# Patient Record
Sex: Male | Born: 1955 | Race: White | Hispanic: No | Marital: Single | Smoking: Current every day smoker
Health system: Southern US, Community
[De-identification: ages and names within clinical notes are randomized; demographics above are authoritative.]

## PROBLEM LIST (undated history)

## (undated) HISTORY — PX: APPENDECTOMY: SHX54

---

## 2016-09-28 ENCOUNTER — Emergency Department: Payer: 59

## 2016-09-28 ENCOUNTER — Emergency Department
Admission: EM | Admit: 2016-09-28 | Discharge: 2016-09-28 | Disposition: A | Discharge status: Home / Self Care | Payer: 59 | Attending: Emergency Medicine | Admitting: Emergency Medicine

## 2016-09-28 ENCOUNTER — Encounter: Payer: Self-pay | Admitting: Emergency Medicine

## 2016-09-28 DIAGNOSIS — I509 Heart failure, unspecified: Secondary | ICD-10-CM | POA: Insufficient documentation

## 2016-09-28 DIAGNOSIS — F17200 Nicotine dependence, unspecified, uncomplicated: Secondary | ICD-10-CM | POA: Diagnosis not present

## 2016-09-28 DIAGNOSIS — R000 Tachycardia, unspecified: Secondary | ICD-10-CM

## 2016-09-28 DIAGNOSIS — R0600 Dyspnea, unspecified: Secondary | ICD-10-CM | POA: Diagnosis present

## 2016-09-28 LAB — URINALYSIS, COMPLETE (UACMP) WITH MICROSCOPIC
Bacteria, UA: NONE SEEN
Bilirubin Urine: NEGATIVE
Glucose, UA: NEGATIVE mg/dL
Hgb urine dipstick: NEGATIVE
KETONES UR: 5 mg/dL — AB
LEUKOCYTES UA: NEGATIVE
NITRITE: NEGATIVE
PH: 5 (ref 5.0–8.0)
Protein, ur: 100 mg/dL — AB
Specific Gravity, Urine: 1.019 (ref 1.005–1.030)

## 2016-09-28 LAB — BASIC METABOLIC PANEL
ANION GAP: 8 (ref 5–15)
BUN: 6 mg/dL (ref 6–20)
CALCIUM: 8.9 mg/dL (ref 8.9–10.3)
CO2: 30 mmol/L (ref 22–32)
Chloride: 100 mmol/L — ABNORMAL LOW (ref 101–111)
Creatinine, Ser: 0.71 mg/dL (ref 0.61–1.24)
GFR calc Af Amer: >60 mL/min (ref >60)
Glucose, Bld: 156 mg/dL — ABNORMAL HIGH (ref 65–99)
Potassium: 3.6 mmol/L (ref 3.5–5.1)
SODIUM: 138 mmol/L (ref 135–145)

## 2016-09-28 LAB — CBC
HCT: 52 % (ref 40.0–52.0)
HEMOGLOBIN: 17.7 g/dL (ref 13.0–18.0)
MCH: 33.1 pg (ref 26.0–34.0)
MCHC: 34 g/dL (ref 32.0–36.0)
MCV: 97.4 fL (ref 80.0–100.0)
Platelets: 185 10*3/uL (ref 150–440)
RBC: 5.34 MIL/uL (ref 4.40–5.90)
RDW: 14.3 % (ref 11.5–14.5)
WBC: 7.9 10*3/uL (ref 3.8–10.6)

## 2016-09-28 LAB — TROPONIN I
TROPONIN I: 0.04 ng/mL — AB (ref <0.03)
Troponin I: 0.04 ng/mL (ref <0.03)

## 2016-09-28 LAB — BRAIN NATRIURETIC PEPTIDE: B Natriuretic Peptide: 958 pg/mL — ABNORMAL HIGH (ref 0.0–100.0)

## 2016-09-28 MED ORDER — FUROSEMIDE 40 MG PO TABS
40.0000 mg | ORAL_TABLET | Freq: Once | ORAL | Status: AC
  Administered 2016-09-28: 40 mg via ORAL
  Filled 2016-09-28: qty 1

## 2016-09-28 MED ORDER — IOPAMIDOL (ISOVUE-370) INJECTION 76%
75.0000 mL | Freq: Once | INTRAVENOUS | Status: AC | PRN
  Administered 2016-09-28: 75 mL via INTRAVENOUS

## 2016-09-28 MED ORDER — LORAZEPAM 2 MG/ML IJ SOLN
INTRAMUSCULAR | Status: AC
  Administered 2016-09-28: 0.5 mg via INTRAVENOUS
  Filled 2016-09-28: qty 1

## 2016-09-28 MED ORDER — FUROSEMIDE 20 MG PO TABS: 20.0000 mg | ORAL_TABLET | Freq: Every day | ORAL | 0 refills | Status: AC

## 2016-09-28 MED ORDER — POTASSIUM CHLORIDE ER 10 MEQ PO TBCR: 10.0000 meq | EXTENDED_RELEASE_TABLET | Freq: Every day | ORAL | 0 refills | Status: AC

## 2016-09-28 MED ORDER — ASPIRIN 81 MG PO CHEW
81.0000 mg | CHEWABLE_TABLET | Freq: Once | ORAL | Status: AC
  Administered 2016-09-28: 81 mg via ORAL
  Filled 2016-09-28: qty 1

## 2016-09-28 MED ORDER — LORAZEPAM 2 MG/ML IJ SOLN
0.5000 mg | Freq: Once | INTRAMUSCULAR | Status: AC
  Administered 2016-09-28: 0.5 mg via INTRAVENOUS

## 2016-09-28 NOTE — ED Provider Notes (Signed)
[facility] Emergency Department Provider Note ____________________________________________   I have reviewed the triage vital signs and the triage nursing note.  HISTORY  Chief Complaint Weakness   Historian Patient is here with a friend  HPI Jeffrey Pittman is a 61 y.o. male has not seen a doctor for about 12 years now, states that over the past several months he's had increased generalized weakness especially with exertion, dyspnea on exertion especially with going upstairs. Symptoms seem to be worse over the past few weeks. He's had increased lower extremity edema, with wheezing especially from the right lower extremity within the last week or so. No fever. No coughing. Denies chest pain. Denies palpitations. Denies abdominal pain. Denies nausea.  Symptoms are considered moderate.  No PCP.    History reviewed. No pertinent past medical history.  There are no active problems to display for this patient.   Past Surgical History:  Procedure Laterality Date  . APPENDECTOMY      Prior to Admission medications   Not on File    No Known Allergies  No family history on file.  Social History Social History  Substance Use Topics  . Smoking status: Current Every Day Smoker  . Smokeless tobacco: Never Used  . Alcohol use Yes    Review of Systems  Constitutional: Negative for fever. Eyes: Negative for visual changes. ENT: Negative for sore throat. Cardiovascular: Negative for chest pain. Respiratory: positive for shortness of breath with exertion, states he can walk around the house without any problem, its longer periods walking the crit exertion and upstairs.. Gastrointestinal: Negative for abdominal pain, vomiting and diarrhea. Genitourinary: Negative for dysuria. Musculoskeletal: Negative for back pain. Skin: Negative for rash. Neurological: Negative for headache. 10 point Review of Systems otherwise  negative ____________________________________________   PHYSICAL EXAM:  VITAL SIGNS: ED Triage Vitals  Enc Vitals Group     BP 09/28/16 1458 (!) 175/93     Pulse Rate 09/28/16 1458 (!) 116     Resp 09/28/16 1458 20     Temp 09/28/16 1458 97.7 F (36.5 C)     Temp Source 09/28/16 1458 Oral     SpO2 09/28/16 1458 96 %     Weight 09/28/16 1458 230 lb (104.3 kg)     Height 09/28/16 1458 6' (1.829 m)     Head Circumference --      Peak Flow --      Pain Score 09/28/16 1609 0     Pain Loc --      Pain Edu? --      Excl. in GC? --      Constitutional: Alert and oriented. Well appearing and in no distress. HEENT   Head: Normocephalic and atraumatic.      Eyes: Conjunctivae are normal. PERRL. Normal extraocular movements.      Ears:         Nose: No congestion/rhinnorhea.   Mouth/Throat: Mucous membranes are moist.   Neck: No stridor. Cardiovascular/Chest:slightly tachycardic, irregularly irregular..  No murmurs, rubs, or gallops. Respiratory: Normal respiratory effort without tachypnea nor retractions. Breath sounds are clear and equal bilaterally. Mildly decreased lung sounds, but no clear wheezing or rhonchi or rales appreciated. Gastrointestinal: Soft. No distention, no guarding, no rebound. Nontender.  Morbidly obese.  Genitourinary/rectal:Deferred Musculoskeletal: Nontender with normal range of motion in all extremities. Severe lower extremity edema bilaterally with some stretching/erythema to both lower extremities without cellulitis with some weeping from the right lower extremity which does not appear infected. Neurologic:  Normal speech and language. No gross or focal neurologic deficits are appreciated. Skin:  Skin is warm, dry and intact. No rash noted. Psychiatric: Mood and affect are normal. Speech and behavior are normal. Patient exhibits appropriate insight and judgment.   ____________________________________________  LABS (pertinent  positives/negatives)  Labs Reviewed  BASIC METABOLIC PANEL - Abnormal; Notable for the following:       Result Value   Chloride 100 (*)    Glucose, Bld 156 (*)    All other components within normal limits  URINALYSIS, COMPLETE (UACMP) WITH MICROSCOPIC - Abnormal; Notable for the following:    Color, Urine AMBER (*)    APPearance CLEAR (*)    Ketones, ur 5 (*)    Protein, ur 100 (*)    Squamous Epithelial / LPF 0-5 (*)    All other components within normal limits  BRAIN NATRIURETIC PEPTIDE - Abnormal; Notable for the following:    B Natriuretic Peptide 958.0 (*)    All other components within normal limits  TROPONIN I - Abnormal; Notable for the following:    Troponin I 0.04 (*)    All other components within normal limits  TROPONIN I - Abnormal; Notable for the following:    Troponin I 0.04 (*)    All other components within normal limits  CBC  CBG MONITORING, ED    ____________________________________________    EKG I, Governor Rooksebecca Awesome Jared, MD, the attending physician have personally viewed and interpreted all ECGs.  101 bpm. Sinus tachycardia. Several PACs. Normal axis.Incomplete right bundle branch block. Nonspecific ST and T-wave. ____________________________________________  RADIOLOGY All Xrays were viewed by me. Imaging interpreted by Radiologist.  Chest x-ray two-view:  IMPRESSION: Congestive heart failure.  Masslike consolidation of the lateral right lung base. Further evaluation with chest CT is recommended.  Lower extremity venous Doppler bilateral:  IMPRESSION: 1. No evidence of deep venous thrombosis. Evaluation is somewhat suboptimal due to guarding and soft tissue edema. 2. Soft tissue edema noted at both calves. 3. 2.2 cm right inguinal node seen. This is of uncertain significance. Would correlate for evidence of infection.   CT chest with contrast angiogram:  IMPRESSION: 1. No evidence of central pulmonary embolus. 2. Mildly loculated small bilateral  pleural effusions. Right basilar density noted on chest radiograph reflects loculated fluid. Mild bibasilar atelectasis seen. Interstitial prominence may reflect mild interstitial edema. 3. Diffuse coronary artery calcifications seen.  Mild cardiomegaly. 4. Enlarged mediastinal and right hilar nodes measure up to 2.2 cm in short axis, of uncertain significance. Further evaluation could be considered as deemed clinically appropriate. 5. Mild bilateral bronchomalacia noted. 6. Diffuse soft tissue edema along the lower chest and upper abdominal wall. __________________________________________  PROCEDURES  Procedure(s) performed: None  Critical Care performed: None  ____________________________________________   ED COURSE / ASSESSMENT AND PLAN  Pertinent labs & imaging results that were available during my care of the patient were reviewed by me and considered in my medical decision making (see chart for details).  Jeffrey Pittman is here for evaluation of several months, but worsening over several weeks of dyspnea on exertion also lower extremity swelling to almost his abdomen. He is also found to have irregularly irregular heartbeat around 105, which is actually on EKG found to be sinus tach with PACs. Clinically appears to have CHF exacerbation, this would be a new diagnosis for him as he has not seen a doctor in many years.  I did give him an initial dose of Lasix. His initial troponin is 0.04 and  repeated was also 0.04.  Chest x-ray was added on, and there is bleeding of masslike consolidation, recommending CT I did order this.  His lower extremities do not appear to be cellulitic to my view. However given the swelling, I will obtain lower extremity Dopplers to rule out DVT.  Feel lower extremities do not look clinically cellulitis or infected, but I will refer him to the wound center.  No hypoxia, no significant respiratory distress with short distances.  He did have a bit of a panic  attack after the CAT scan, but was improved with lower blood pressure and feeling calm after small dose of Ativan.  I spoke with on-call cardiologist Dr. Graciela Husbands who recommended in-hospital workup for new onset congestive heart failure.   I discussed this with the patient at length, and the risk for arrhythmia,/pulmonary edema and death, and he understands the risks and is capable of making his own decisions and requests to go home.  HR 99.  I have requested that he call the cardiologist office in the morning to try to make a same day appointment. We discussed return precautions.  I also offered wound clinic follow-up instructions as well as contact information for [facility] clinic for primary care.  CONSULTATIONS:   Dr. Graciela Husbands, cardiology.   Patient / Family / Caregiver informed of clinical course, medical decision-making process, and agree with plan.   I discussed return precautions, follow-up instructions, and discharge instructions with patient and/or family.   ___________________________________________   FINAL CLINICAL IMPRESSION(S) / ED DIAGNOSES   Final diagnoses:  Congestive heart failure, unspecified congestive heart failure chronicity, unspecified congestive heart failure type [facility])              Note: This dictation was prepared with Dragon dictation. Any transcriptional errors that result from this process are unintentional    Governor Rooks, MD 09/28/16 2201

## 2016-09-28 NOTE — ED Triage Notes (Signed)
Patient reports he feels that he may have new onset diabetes. States in last week or so he developed tingling pain to legs. Has never been diagnosed with diabetes. Also reports generalized fatigue, but has been "working a lot". Also reports swelling to BLE and up to abdomen. Denies h/o CHF.

## 2016-09-28 NOTE — ED Notes (Signed)
Patient transported to X-ray 

## 2016-09-28 NOTE — ED Notes (Signed)

## 2016-09-28 NOTE — ED Notes (Signed)
Patient transported to Ultrasound 

## 2016-09-28 NOTE — ED Notes (Signed)
MD Lord at bedside. 

## 2016-09-28 NOTE — Discharge Instructions (Signed)
As we discussed, the cardiologist Dr. Graciela HusbandsKlein and myself recommended hospital admission for further diagnosis and treatment for congestive heart failure, but you chose to go home tonight. Please call the office of the cardiologist tomorrow to try to make a same day appointment.  In any case, your prescribed Lasix to help you excrete the excess fluid.  For your legs, right now I don't see sign of infection, but if you develop new or worsening pain, redness, yellow pus drainage, fevers, you certainly need to be reevaluated. For chronic wound healing, you're requested to follow-up with the [facility].  Return to the emergency department immediately for any worsening symptoms of trouble breathing, shortness of breath, palpitations, dizziness or passing out, chest pain, or signs of infection as we discussed.

## 2016-09-28 NOTE — ED Notes (Addendum)
Pt c/o weakness since before Christmas, denies any n/v/d. Pt states he started noticing edema in lower extremities and fluid has been moving up , now into his abdomen. Pt denies any hx of CHF or fluid rentention. Pt A&OX4. Denies CP or SOB. Also pt says he thinks he may have diabetes, tingling to lower extremities and slower healing to skin than usual.

## 2016-09-29 ENCOUNTER — Telehealth: Payer: Self-pay

## 2016-09-29 NOTE — Telephone Encounter (Signed)
Pt is coming on 10/05/16

## 2016-09-29 NOTE — Telephone Encounter (Signed)
Lmov for patient to call back and schedule ED Fu  Was seen for weakness  Will try again later time.

## 2016-10-05 ENCOUNTER — Ambulatory Visit: Payer: 59 | Admitting: Cardiology

## 2017-10-06 IMAGING — CR DG CHEST 2V
1 series · 2 of 2 positions shown · non-contrast
Comparison: None.

CLINICAL DATA: Weakness since [REDACTED]. Edema of bilateral lower
extremities.

EXAM:
CHEST  2 VIEW

[Series 1: dg chest 2 view · 0.14mm/px · 2 of 2 slices shown]
[im 1/2]
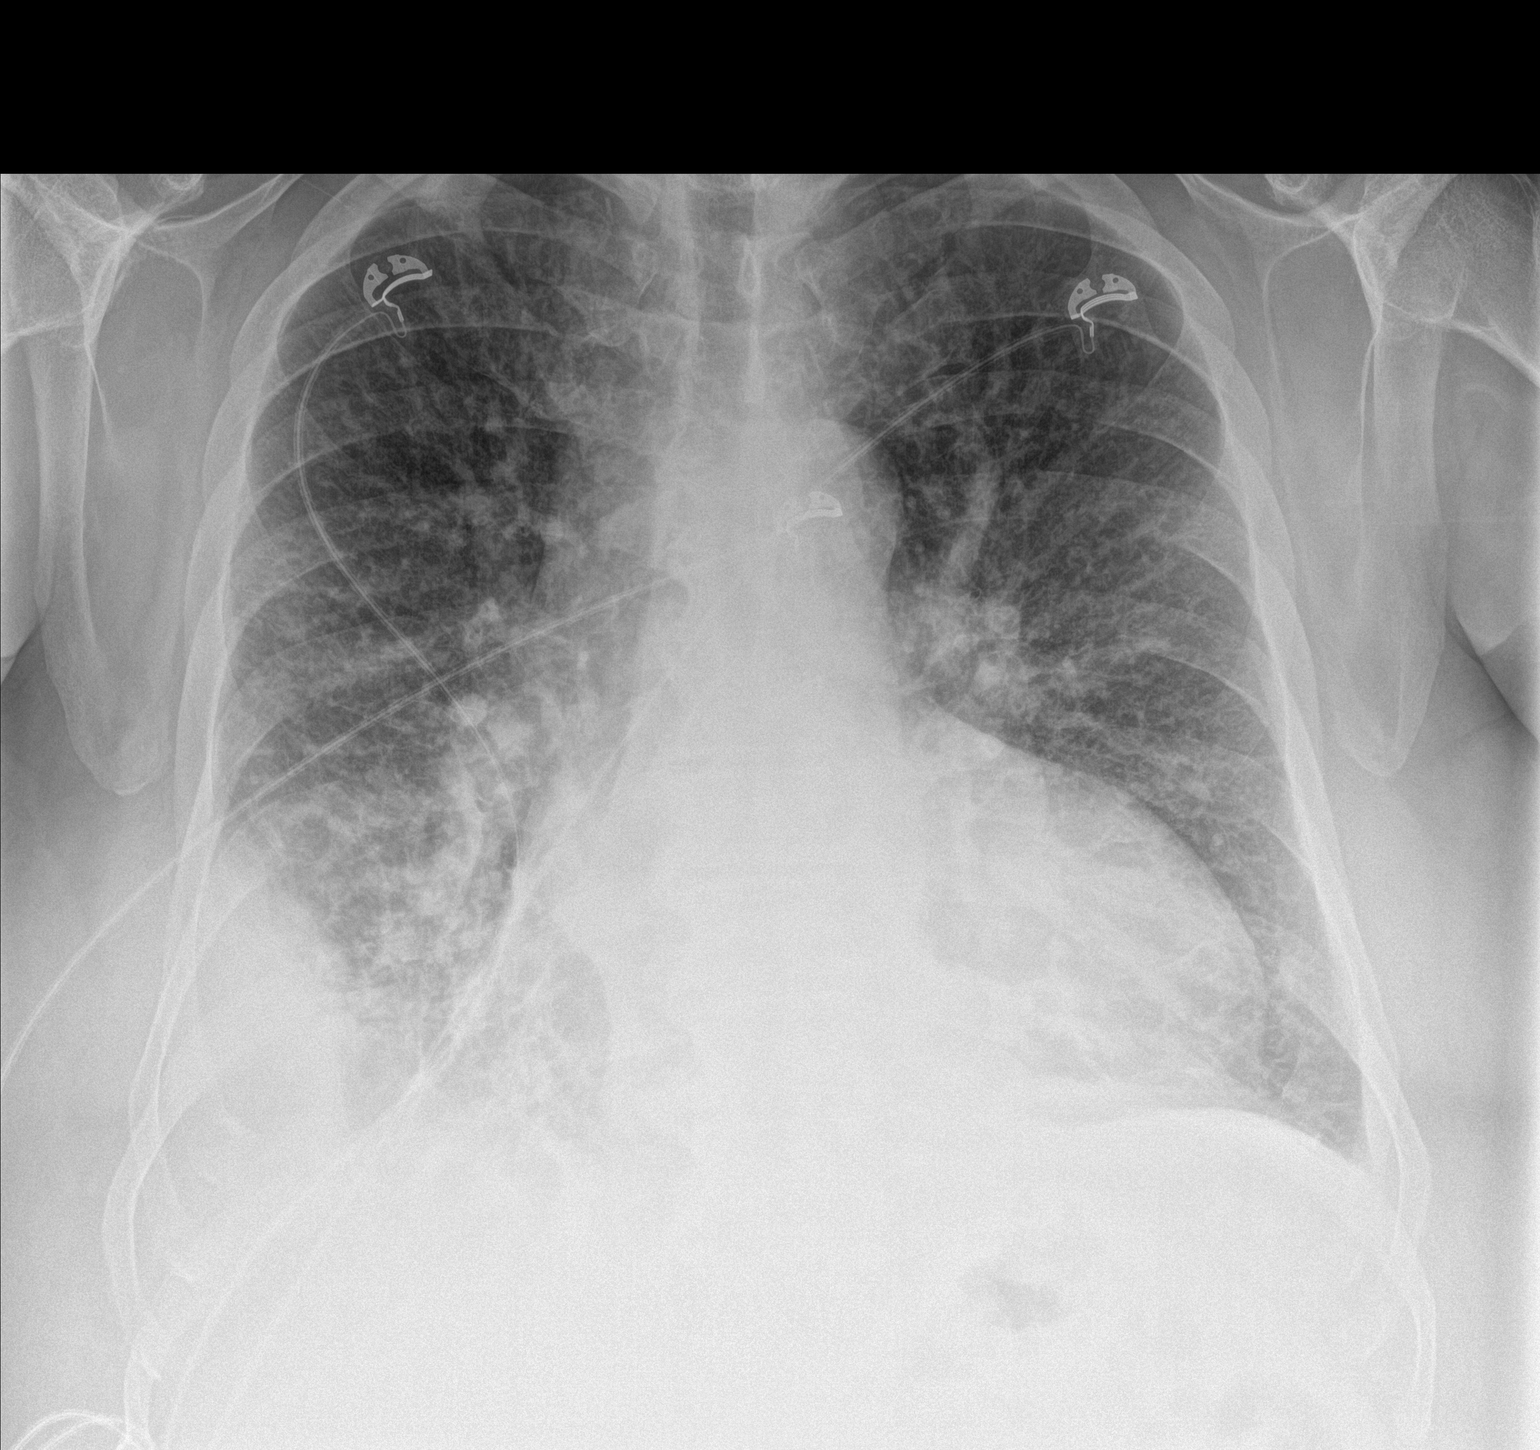
[im 2/2]
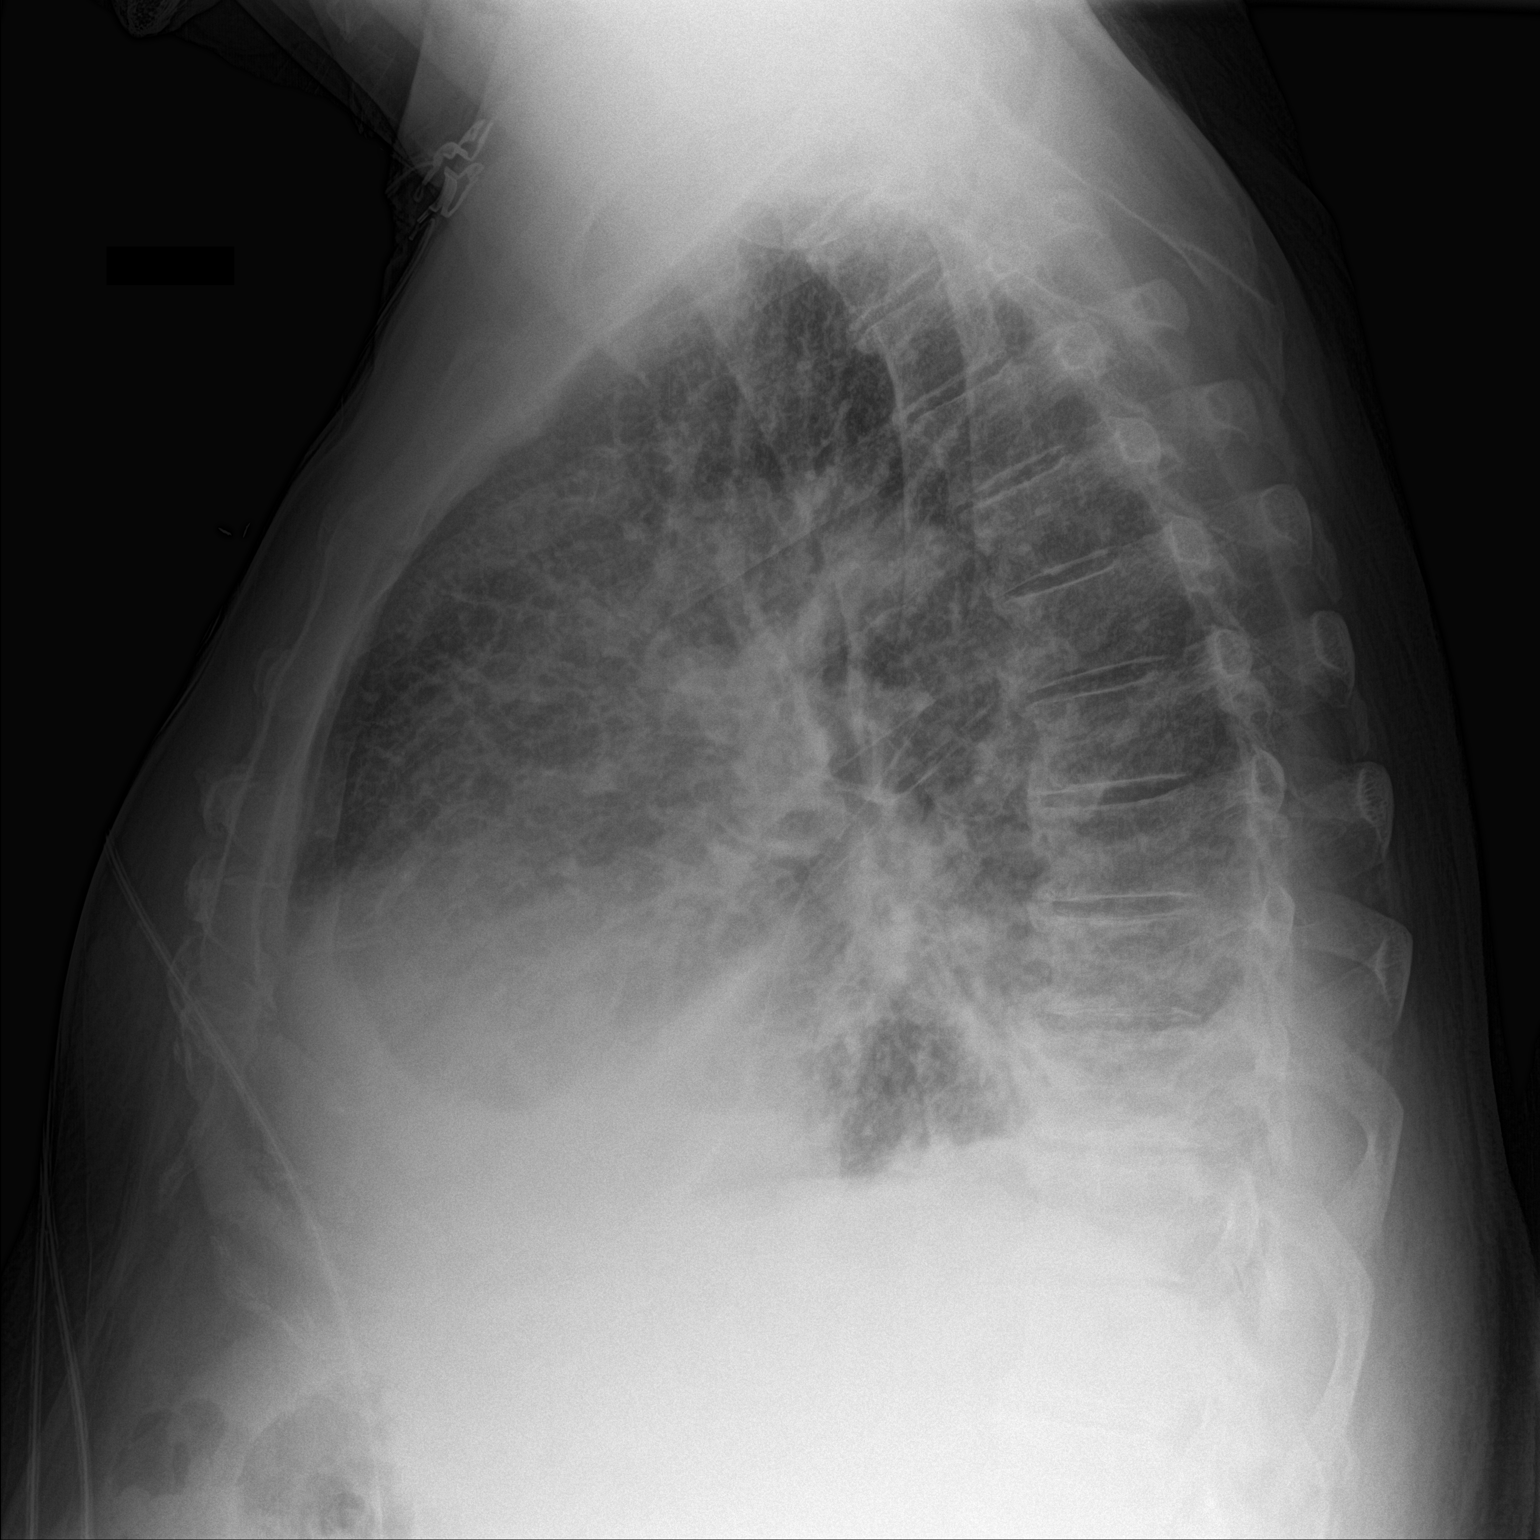

[2 of 2 positions shown; findings below may reference images not displayed]

FINDINGS: The heart size is enlarged. There is pulmonary edema. The
mediastinal contour is normal. There is minimal left pleural
effusion. Masslike consolidation is identified in the lateral right
lung base.
IMPRESSION: Congestive heart failure.

Masslike consolidation of the lateral right lung base. Further
evaluation with chest CT is recommended.

## 2022-04-12 ENCOUNTER — Inpatient Hospital Stay
Admission: EM | Admit: 2022-04-12 | Discharge: 2022-04-18 | DRG: 871 | Disposition: E | Payer: Self-pay | Attending: Internal Medicine | Admitting: Internal Medicine

## 2022-04-12 ENCOUNTER — Emergency Department: Payer: Self-pay

## 2022-04-12 ENCOUNTER — Emergency Department: Payer: 59

## 2022-04-12 DIAGNOSIS — L89309 Pressure ulcer of unspecified buttock, unspecified stage: Secondary | ICD-10-CM | POA: Diagnosis present

## 2022-04-12 DIAGNOSIS — L89109 Pressure ulcer of unspecified part of back, unspecified stage: Secondary | ICD-10-CM | POA: Diagnosis present

## 2022-04-12 DIAGNOSIS — N179 Acute kidney failure, unspecified: Secondary | ICD-10-CM | POA: Diagnosis present

## 2022-04-12 DIAGNOSIS — J9690 Respiratory failure, unspecified, unspecified whether with hypoxia or hypercapnia: Secondary | ICD-10-CM | POA: Diagnosis present

## 2022-04-12 DIAGNOSIS — J9601 Acute respiratory failure with hypoxia: Secondary | ICD-10-CM | POA: Diagnosis present

## 2022-04-12 DIAGNOSIS — Z20822 Contact with and (suspected) exposure to covid-19: Secondary | ICD-10-CM | POA: Diagnosis present

## 2022-04-12 DIAGNOSIS — I509 Heart failure, unspecified: Secondary | ICD-10-CM | POA: Diagnosis present

## 2022-04-12 DIAGNOSIS — E872 Acidosis, unspecified: Secondary | ICD-10-CM | POA: Diagnosis present

## 2022-04-12 DIAGNOSIS — J9691 Respiratory failure, unspecified with hypoxia: Secondary | ICD-10-CM

## 2022-04-12 DIAGNOSIS — G9341 Metabolic encephalopathy: Secondary | ICD-10-CM | POA: Diagnosis present

## 2022-04-12 DIAGNOSIS — R4182 Altered mental status, unspecified: Principal | ICD-10-CM

## 2022-04-12 DIAGNOSIS — K7200 Acute and subacute hepatic failure without coma: Secondary | ICD-10-CM | POA: Diagnosis present

## 2022-04-12 DIAGNOSIS — I5020 Unspecified systolic (congestive) heart failure: Secondary | ICD-10-CM | POA: Diagnosis present

## 2022-04-12 DIAGNOSIS — I609 Nontraumatic subarachnoid hemorrhage, unspecified: Secondary | ICD-10-CM | POA: Diagnosis present

## 2022-04-12 DIAGNOSIS — I471 Supraventricular tachycardia: Secondary | ICD-10-CM | POA: Diagnosis present

## 2022-04-12 DIAGNOSIS — J189 Pneumonia, unspecified organism: Secondary | ICD-10-CM | POA: Diagnosis present

## 2022-04-12 DIAGNOSIS — I4891 Unspecified atrial fibrillation: Secondary | ICD-10-CM | POA: Diagnosis present

## 2022-04-12 DIAGNOSIS — A419 Sepsis, unspecified organism: Principal | ICD-10-CM

## 2022-04-12 DIAGNOSIS — R570 Cardiogenic shock: Secondary | ICD-10-CM | POA: Diagnosis present

## 2022-04-12 DIAGNOSIS — F17200 Nicotine dependence, unspecified, uncomplicated: Secondary | ICD-10-CM | POA: Diagnosis present

## 2022-04-12 DIAGNOSIS — D649 Anemia, unspecified: Secondary | ICD-10-CM | POA: Diagnosis present

## 2022-04-12 DIAGNOSIS — M6282 Rhabdomyolysis: Secondary | ICD-10-CM | POA: Diagnosis present

## 2022-04-12 DIAGNOSIS — I248 Other forms of acute ischemic heart disease: Secondary | ICD-10-CM | POA: Diagnosis present

## 2022-04-12 DIAGNOSIS — R6521 Severe sepsis with septic shock: Secondary | ICD-10-CM | POA: Diagnosis present

## 2022-04-12 DIAGNOSIS — Z79899 Other long term (current) drug therapy: Secondary | ICD-10-CM

## 2022-04-12 DIAGNOSIS — J9602 Acute respiratory failure with hypercapnia: Secondary | ICD-10-CM | POA: Diagnosis present

## 2022-04-12 DIAGNOSIS — Z66 Do not resuscitate: Secondary | ICD-10-CM | POA: Diagnosis present

## 2022-04-12 DIAGNOSIS — I272 Pulmonary hypertension, unspecified: Secondary | ICD-10-CM | POA: Diagnosis present

## 2022-04-12 LAB — BLOOD GAS, ARTERIAL
Acid-base deficit: 8.2 mmol/L — ABNORMAL HIGH (ref 0.0–2.0)
Bicarbonate: 17.9 mmol/L — ABNORMAL LOW (ref 20.0–28.0)
FIO2: 100 %
MECHVT: 550 mL
Mechanical Rate: 20
O2 Saturation: 100 %
PEEP: 10 cmH2O
Patient temperature: 37
pCO2 arterial: 38 mmHg (ref 32–48)
pH, Arterial: 7.28 — ABNORMAL LOW (ref 7.35–7.45)
pO2, Arterial: 293 mmHg — ABNORMAL HIGH (ref 83–108)

## 2022-04-12 LAB — COMPREHENSIVE METABOLIC PANEL
ALT: 110 U/L — ABNORMAL HIGH (ref 0–44)
AST: 173 U/L — ABNORMAL HIGH (ref 15–41)
Albumin: 3.3 g/dL — ABNORMAL LOW (ref 3.5–5.0)
Alkaline Phosphatase: 119 U/L (ref 38–126)
Anion gap: 20 — ABNORMAL HIGH (ref 5–15)
BUN: 70 mg/dL — ABNORMAL HIGH (ref 8–23)
CO2: 19 mmol/L — ABNORMAL LOW (ref 22–32)
Calcium: 9.2 mg/dL (ref 8.9–10.3)
Chloride: 104 mmol/L (ref 98–111)
Creatinine, Ser: 3.79 mg/dL — ABNORMAL HIGH (ref 0.61–1.24)
GFR, Estimated: 17 mL/min — ABNORMAL LOW (ref >60)
Glucose, Bld: 360 mg/dL — ABNORMAL HIGH (ref 70–99)
Potassium: 5.1 mmol/L (ref 3.5–5.1)
Sodium: 143 mmol/L (ref 135–145)
Total Bilirubin: 2.8 mg/dL — ABNORMAL HIGH (ref 0.3–1.2)
Total Protein: 6.7 g/dL (ref 6.5–8.1)

## 2022-04-12 LAB — MRSA NEXT GEN BY PCR, NASAL: MRSA by PCR Next Gen: NOT DETECTED

## 2022-04-12 LAB — URINALYSIS, ROUTINE W REFLEX MICROSCOPIC
Glucose, UA: 50 mg/dL — AB
Ketones, ur: NEGATIVE mg/dL
Leukocytes,Ua: NEGATIVE
Nitrite: NEGATIVE
Protein, ur: 30 mg/dL — AB
Specific Gravity, Urine: 1.018 (ref 1.005–1.030)
pH: 5 (ref 5.0–8.0)

## 2022-04-12 LAB — CBC WITH DIFFERENTIAL/PLATELET
Abs Immature Granulocytes: 0.1 10*3/uL — ABNORMAL HIGH (ref 0.00–0.07)
Basophils Absolute: 0 10*3/uL (ref 0.0–0.1)
Basophils Relative: 0 %
Eosinophils Absolute: 0 10*3/uL (ref 0.0–0.5)
Eosinophils Relative: 0 %
HCT: 62 % — ABNORMAL HIGH (ref 39.0–52.0)
Hemoglobin: 20.5 g/dL — ABNORMAL HIGH (ref 13.0–17.0)
Immature Granulocytes: 1 %
Lymphocytes Relative: 13 %
Lymphs Abs: 1.4 10*3/uL (ref 0.7–4.0)
MCH: 32.3 pg (ref 26.0–34.0)
MCHC: 33.1 g/dL (ref 30.0–36.0)
MCV: 97.8 fL (ref 80.0–100.0)
Monocytes Absolute: 0.9 10*3/uL (ref 0.1–1.0)
Monocytes Relative: 9 %
Neutro Abs: 8.3 10*3/uL — ABNORMAL HIGH (ref 1.7–7.7)
Neutrophils Relative %: 77 %
Platelets: 163 10*3/uL (ref 150–400)
RBC: 6.34 MIL/uL — ABNORMAL HIGH (ref 4.22–5.81)
RDW: 13.6 % (ref 11.5–15.5)
WBC: 10.7 10*3/uL — ABNORMAL HIGH (ref 4.0–10.5)
nRBC: 0.2 % (ref 0.0–0.2)

## 2022-04-12 LAB — URINE DRUG SCREEN, QUALITATIVE (ARMC ONLY)
Amphetamines, Ur Screen: NOT DETECTED
Barbiturates, Ur Screen: NOT DETECTED
Benzodiazepine, Ur Scrn: NOT DETECTED
Cannabinoid 50 Ng, Ur ~~LOC~~: NOT DETECTED
Cocaine Metabolite,Ur ~~LOC~~: NOT DETECTED
MDMA (Ecstasy)Ur Screen: NOT DETECTED
Methadone Scn, Ur: NOT DETECTED
Opiate, Ur Screen: NOT DETECTED
Phencyclidine (PCP) Ur S: NOT DETECTED
Tricyclic, Ur Screen: NOT DETECTED

## 2022-04-12 LAB — CBG MONITORING, ED: Glucose-Capillary: 385 mg/dL — ABNORMAL HIGH (ref 70–99)

## 2022-04-12 LAB — TROPONIN I (HIGH SENSITIVITY)
Troponin I (High Sensitivity): 453 ng/L (ref <18)
Troponin I (High Sensitivity): 573 ng/L (ref <18)

## 2022-04-12 LAB — LACTIC ACID, PLASMA
Lactic Acid, Venous: 5.1 mmol/L (ref 0.5–1.9)
Lactic Acid, Venous: 4.9 mmol/L (ref 0.5–1.9)

## 2022-04-12 LAB — MAGNESIUM: Magnesium: 2.7 mg/dL — ABNORMAL HIGH (ref 1.7–2.4)

## 2022-04-12 LAB — AMMONIA: Ammonia: 69 umol/L — ABNORMAL HIGH (ref 9–35)

## 2022-04-12 LAB — CK: Total CK: 1038 U/L — ABNORMAL HIGH (ref 49–397)

## 2022-04-12 LAB — BRAIN NATRIURETIC PEPTIDE: B Natriuretic Peptide: 1466 pg/mL — ABNORMAL HIGH (ref 0.0–100.0)

## 2022-04-12 LAB — ETHANOL: Alcohol, Ethyl (B): <10 mg/dL (ref <10)

## 2022-04-12 LAB — LIPASE, BLOOD: Lipase: 99 U/L — ABNORMAL HIGH (ref 11–51)

## 2022-04-12 LAB — GLUCOSE, CAPILLARY: Glucose-Capillary: 349 mg/dL — ABNORMAL HIGH (ref 70–99)

## 2022-04-12 LAB — HIV ANTIBODY (ROUTINE TESTING W REFLEX): HIV Screen 4th Generation wRfx: NONREACTIVE

## 2022-04-12 LAB — SARS CORONAVIRUS 2 BY RT PCR: SARS Coronavirus 2 by RT PCR: NEGATIVE

## 2022-04-12 MED ORDER — LACTATED RINGERS IV BOLUS
1000.0000 mL | Freq: Once | INTRAVENOUS | Status: AC
  Administered 2022-04-12: 1000 mL via INTRAVENOUS

## 2022-04-12 MED ORDER — DOCUSATE SODIUM 50 MG/5ML PO LIQD: 100.0000 mg | Freq: Two times a day (BID) | ORAL | Status: DC | PRN

## 2022-04-12 MED ORDER — ROCURONIUM BROMIDE 50 MG/5ML IV SOLN: 100.0000 mg | Freq: Once | INTRAVENOUS | Status: AC

## 2022-04-12 MED ORDER — ORAL CARE MOUTH RINSE: 15.0000 mL | OROMUCOSAL | Status: DC | PRN

## 2022-04-12 MED ORDER — SODIUM CHLORIDE 0.9 % IV SOLN
250.0000 mL | INTRAVENOUS | Status: DC
  Administered 2022-04-12: 250 mL via INTRAVENOUS

## 2022-04-12 MED ORDER — CHLORHEXIDINE GLUCONATE CLOTH 2 % EX PADS
6.0000 | MEDICATED_PAD | Freq: Every day | CUTANEOUS | Status: DC
  Administered 2022-04-12: 6 via TOPICAL

## 2022-04-12 MED ORDER — PANTOPRAZOLE 2 MG/ML SUSPENSION
40.0000 mg | Freq: Every day | ORAL | Status: DC
Start: 2022-04-12 — End: 2022-04-13
  Filled 2022-04-12: qty 20

## 2022-04-12 MED ORDER — AMIODARONE LOAD VIA INFUSION
150.0000 mg | Freq: Once | INTRAVENOUS | Status: AC
  Administered 2022-04-12: 150 mg via INTRAVENOUS
  Filled 2022-04-12: qty 83.34

## 2022-04-12 MED ORDER — NOREPINEPHRINE 4 MG/250ML-% IV SOLN
INTRAVENOUS | Status: AC
  Filled 2022-04-12: qty 250

## 2022-04-12 MED ORDER — ONDANSETRON HCL 4 MG/2ML IJ SOLN: 4.0000 mg | Freq: Four times a day (QID) | INTRAMUSCULAR | Status: DC | PRN

## 2022-04-12 MED ORDER — FENTANYL 2500MCG IN NS 250ML (10MCG/ML) PREMIX INFUSION
0.0000 ug/h | INTRAVENOUS | Status: DC
  Administered 2022-04-12: 25 ug/h via INTRAVENOUS
  Filled 2022-04-12: qty 250

## 2022-04-12 MED ORDER — SODIUM CHLORIDE 0.9% FLUSH: 3.0000 mL | INTRAVENOUS | Status: DC | PRN

## 2022-04-12 MED ORDER — PANTOPRAZOLE SODIUM 40 MG IV SOLR
40.0000 mg | Freq: Every day | INTRAVENOUS | Status: DC
Start: 2022-04-12 — End: 2022-04-13

## 2022-04-12 MED ORDER — AMIODARONE HCL IN DEXTROSE 360-4.14 MG/200ML-% IV SOLN
60.0000 mg/h | INTRAVENOUS | Status: DC
  Administered 2022-04-12: 60 mg/h via INTRAVENOUS
  Filled 2022-04-12: qty 200

## 2022-04-12 MED ORDER — ACETAMINOPHEN 10 MG/ML IV SOLN
1000.0000 mg | Freq: Once | INTRAVENOUS | Status: DC
  Filled 2022-04-12: qty 100

## 2022-04-12 MED ORDER — METRONIDAZOLE 500 MG/100ML IV SOLN
500.0000 mg | Freq: Once | INTRAVENOUS | Status: AC
Start: 2022-04-12 — End: 2022-04-12
  Administered 2022-04-12: 500 mg via INTRAVENOUS
  Filled 2022-04-12: qty 100

## 2022-04-12 MED ORDER — METOPROLOL TARTRATE 5 MG/5ML IV SOLN: 2.5000 mg | Freq: Once | INTRAVENOUS | Status: DC

## 2022-04-12 MED ORDER — NOREPINEPHRINE 4 MG/250ML-% IV SOLN: 0.0000 ug/min | INTRAVENOUS | Status: DC

## 2022-04-12 MED ORDER — PHENYLEPHRINE HCL-NACL 20-0.9 MG/250ML-% IV SOLN
25.0000 ug/min | INTRAVENOUS | Status: DC
  Administered 2022-04-12: 25 ug/min via INTRAVENOUS
  Filled 2022-04-12: qty 250

## 2022-04-12 MED ORDER — ACETAMINOPHEN 325 MG PO TABS: 650.0000 mg | ORAL_TABLET | ORAL | Status: DC | PRN

## 2022-04-12 MED ORDER — ETOMIDATE 2 MG/ML IV SOLN
10.0000 mg | Freq: Once | INTRAVENOUS | Status: AC
  Administered 2022-04-12: 10 mg via INTRAVENOUS
  Filled 2022-04-12: qty 10

## 2022-04-12 MED ORDER — ROCURONIUM BROMIDE 10 MG/ML (PF) SYRINGE
PREFILLED_SYRINGE | INTRAVENOUS | Status: AC
  Administered 2022-04-12: 100 mg via INTRAVENOUS
  Filled 2022-04-12: qty 10

## 2022-04-12 MED ORDER — SODIUM CHLORIDE 0.9% FLUSH
3.0000 mL | Freq: Two times a day (BID) | INTRAVENOUS | Status: DC
Start: 2022-04-12 — End: 2022-04-13

## 2022-04-12 MED ORDER — SODIUM CHLORIDE 0.9 % IV SOLN: 250.0000 mL | INTRAVENOUS | Status: DC | PRN

## 2022-04-12 MED ORDER — CEFEPIME HCL 2 G IV SOLR
2.0000 g | Freq: Once | INTRAVENOUS | Status: AC
Start: 2022-04-12 — End: 2022-04-12
  Administered 2022-04-12: 2 g via INTRAVENOUS
  Filled 2022-04-12: qty 12.5

## 2022-04-12 MED ORDER — PHENYLEPHRINE HCL-NACL 20-0.9 MG/250ML-% IV SOLN: 0.0000 ug/min | INTRAVENOUS | Status: DC

## 2022-04-12 MED ORDER — ORAL CARE MOUTH RINSE: 15.0000 mL | OROMUCOSAL | Status: DC

## 2022-04-12 MED ORDER — VANCOMYCIN HCL IN DEXTROSE 1-5 GM/200ML-% IV SOLN
1000.0000 mg | Freq: Once | INTRAVENOUS | Status: AC
  Administered 2022-04-12: 1000 mg via INTRAVENOUS
  Filled 2022-04-12: qty 200

## 2022-04-12 MED ORDER — SODIUM CHLORIDE 0.9 % IV BOLUS
1000.0000 mL | Freq: Once | INTRAVENOUS | Status: AC
  Administered 2022-04-12: 1000 mL via INTRAVENOUS

## 2022-04-12 MED ORDER — POLYETHYLENE GLYCOL 3350 17 G PO PACK: 17.0000 g | PACK | Freq: Every day | ORAL | Status: DC | PRN

## 2022-04-12 MED ORDER — AMIODARONE HCL IN DEXTROSE 360-4.14 MG/200ML-% IV SOLN: 30.0000 mg/h | INTRAVENOUS | Status: DC

## 2022-04-13 LAB — BLOOD CULTURE ID PANEL (REFLEXED) - BCID2

## 2022-04-15 LAB — CULTURE, BLOOD (ROUTINE X 2)

## 2022-04-17 LAB — CULTURE, BLOOD (ROUTINE X 2)
Culture: NO GROWTH
Special Requests: ADEQUATE

## 2022-04-18 NOTE — Sepsis Progress Note (Addendum)
Notified provider of need to order additional fluid bolus. Goal fluid resuscitation needs for 104.3kg bodyweight = 3129 ml.  ADD: MD holding off on additional fluids for possible cardiogenic shock.

## 2022-04-18 NOTE — ED Notes (Signed)
Informed RN bed assigned 

## 2022-04-18 NOTE — H&P (Signed)
NAME:  Jeffrey Pittman, MRN:  [id number], DOB:  19-Jan-1956, LOS: 0 ADMISSION DATE:  04/07/2022  BRIEF SYNOPSIS  History of Present Illness:  66 y.o. male who was last seen over a week ago who comes in with concern for unresponsiveness and altered mental status.  Report of only family member dying recently  According to EMS somebody has been trying to call him but is not been able to get a response.   Someone went into check on him and he was found on the ground unresponsive.    neighbors called them bc they hadnt seen him in days and the house was filled with junk, he was on the ground, his cat on top of him licking his face   Patient looks like he has been there for a long time with skin color changes but still breathing.  Patient tachycardic to the 180s.  critically ill, intubated and requiring multiple life-saving interventions  ER LABS and VS HR 160 AFIB, low BP LA 5 CREAT 3.7 TOX NEG EKG SVT WITH ABERRANCY, AFIB COVID NEG AST 173, ALT 110 TROP 453 BNP 1466  CT HEAD subacute bilateral frontal and posteroinferior right occipital infarcts with associated petechial hemorrhage. Possible trace right frontal subarachnoid hemorrhage. A larger region of subacute infarct within the right parietal lobe is seen that does not demonstrate hemorrhage  C SPINE AND FACE NO ACUTE FRACTURES   Pertinent  Medical History   Active Ambulatory Problems    Diagnosis Date Noted   No Active Ambulatory Problems   Resolved Ambulatory Problems    Diagnosis Date Noted   No Resolved Ambulatory Problems   No Additional Past Medical History     Significant Hospital Events: Including procedures, antibiotic start and stop dates in addition to other pertinent events   7/26 Admitted for SHOCK and severe brain damage     Antimicrobials:   Antibiotics Given (last 72 hours)     Date/Time Action Medication Dose Rate   04/10/2022 1600 New Bag/Given   ceFEPIme (MAXIPIME) 2 g in sodium chloride  0.9 % 100 mL IVPB 2 g 200 mL/hr   03/18/2022 1627 New Bag/Given   vancomycin (VANCOCIN) IVPB 1000 mg/200 mL premix 1,000 mg 200 mL/hr   04/17/2022 1738 New Bag/Given   metroNIDAZOLE (FLAGYL) IVPB 500 mg 500 mg 100 mL/hr               Objective   Blood pressure 98/76, pulse (!) 155, temperature (!) 100.6 F (38.1 C), temperature source Rectal, resp. rate (!) 22, SpO2 93 %.    Vent Mode: AC FiO2 (%):  [60 %-100 %] 100 % Set Rate:  [20 bmp] 20 bmp Vt Set:  [550 mL] 550 mL PEEP:  [10 cmH20] 10 cmH20   Intake/Output Summary (Last 24 hours) at 04/14/2022 1752 Last data filed at 03/30/2022 1739 Gross per 24 hour  Intake 2300 ml  Output --  Net 2300 ml   There were no vitals filed for this visit.    REVIEW OF SYSTEMS  PATIENT IS UNABLE TO PROVIDE COMPLETE REVIEW OF SYSTEMS DUE TO SEVERE CRITICAL ILLNESS AND TOXIC METABOLIC ENCEPHALOPATHY   PHYSICAL EXAMINATION:  GENERAL:critically ill appearing, +resp distress MOTTLED EYES: Pupils equal, round, reactive to light.  No scleral icterus.  MOUTH: Moist mucosal membrane. INTUBATED NECK: Supple.  PULMONARY: +rhonchi, +wheezing CARDIOVASCULAR: S1 and S2.  No murmurs  GASTROINTESTINAL: Soft, nontender, -distended. Positive bowel sounds.  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 leading to acute metabolic encephalopathy with cardiogenic shock and AFIB  Severe ACUTE Hypoxic and Hypercapnic Respiratory Failure -continue Mechanical Ventilator support -continue Bronchodilator Therapy -Wean Fio2 and PEEP as tolerated -VAP/VENT bundle implementation -will NOT perform SAT/SBT when respiratory parameters are met  Vent Mode: AC FiO2 (%):  [60 %-100 %] 100 % Set Rate:   [20 bmp] 20 bmp Vt Set:  [550 mL] 550 mL PEEP:  [10 cmH20] 10 cmH20  CARDIAC FAILURE-DEMAND ISCHEMIA ASSESS CARDIAC FUNTION -oxygen as needed -Lasix as tolerated -follow up cardiac enzymes as indicated Check ECHO   CARDIAC ICU monitoring   ACUTE KIDNEY INJURY/Renal Failure -continue Foley Catheter-assess need -Avoid nephrotoxic agents -Follow urine output, BMP -Ensure adequate renal perfusion, optimize oxygenation -Renal dose medications   Intake/Output Summary (Last 24 hours) at 03/31/2022 1752 Last data filed at 03/23/2022 1739 Gross per 24 hour  Intake 2300 ml  Output --  Net 2300 ml     NEUROLOGY Acute metabolic encephalopathy, need for sedation CT head c/w strokes and bleeding Findings c/w severe brain damage    CARDIOGENIC SHOCK -use vasopressors to keep MAP>65 as needed -follow ABG and LA -follow up cultures -emperic ABX   INFECTIOUS DISEASE -continue antibiotics as prescribed -follow up cultures   ENDO - ICU hypoglycemic\Hyperglycemia protocol -check FSBS per protocol   GI GI PROPHYLAXIS as indicated  NUTRITIONAL STATUS DIET-->NPO Constipation protocol as indicated   ELECTROLYTES -follow labs as needed -replace as needed -pharmacy consultation and following   ACUTE ANEMIA- TRANSFUSE AS NEEDED CONSIDER TRANSFUSION  IF HGB<7 DVT PRX with TED/SCD's ONLY     Best practice (right click and "Reselect all SmartList Selections" daily)  Diet: NPO Pain/Anxiety/Delirium protocol (if indicated): No VAP protocol (if indicated): Yes DVT prophylaxis: Contraindicated Code Status:  FULL Disposition:ICU  Labs   CBC: Recent Labs  Lab 03/21/2022 1358  WBC 10.7*  NEUTROABS 8.3*  HGB 20.5*  HCT 62.0*  MCV 97.8  PLT 130    Basic Metabolic Panel: Recent Labs  Lab 04/14/2022 1358  NA 143  K 5.1  CL 104  CO2 19*  GLUCOSE 360*  BUN 70*  CREATININE 3.79*  CALCIUM 9.2  MG 2.7*   GFR: CrCl cannot be calculated (Unknown ideal  weight.). Recent Labs  Lab 03/28/2022 1350 04/13/2022 1358  WBC  --  10.7*  LATICACIDVEN 5.1*  --     Liver Function Tests: Recent Labs  Lab 03/20/2022 1358  AST 173*  ALT 110*  ALKPHOS 119  BILITOT 2.8*  PROT 6.7  ALBUMIN 3.3*   Recent Labs  Lab 04/11/2022 1358  LIPASE 99*   Recent Labs  Lab 04/08/2022 1350  AMMONIA 69*    ABG    Component Value Date/Time   PHART 7.28 (L) 03/18/2022 1425   PCO2ART 38 03/21/2022 1425   PO2ART 293 (H) 04/04/2022 1425   HCO3 17.9 (L) 03/25/2022 1425   ACIDBASEDEF 8.2 (H) 04/04/2022 1425   O2SAT 100 03/22/2022 1425     Coagulation Profile: No results for input(s): "INR", "PROTIME" in the last 168 hours.  Cardiac Enzymes: Recent Labs  Lab 03/19/2022 1358  CKTOTAL 1,038*    HbA1C: No results found for: "HGBA1C"  CBG: Recent Labs  Lab 03/23/2022 1330  GLUCAP 385*     Past Medical History:  He,  has no past medical history on file.    Social History:   reports that he has been smoking. He has never used smokeless tobacco. He reports current alcohol use.   Family History:  His family history is not on file.   Allergies No Known Allergies   Home Medications  Prior to Admission medications   Medication Sig Start Date End Date Taking? Authorizing Provider  furosemide (LASIX) 20 MG tablet Take 1 tablet (20 mg total) by mouth daily. 09/28/16   Lisa Roca, MD  potassium chloride (K-DUR) 10 MEQ tablet Take 1 tablet (10 mEq total) by mouth daily. 09/28/16   Lisa Roca, MD      THERE IS NO FAMILY AVAILABLE, PATIENT IS CRITICALLY ILL WITH MULTIORGAN FAILURE PROGNOSIS IS GRAVE  IF NO FAMILY IS AVAILABLE, WE WILL NEED TO CONSIDER DNR STATUS   Critical Care Time devoted to patient care services described in this note is 65 minutes.   Critical care was necessary to treat /prevent imminent and life-threatening deterioration.   PATIENT WITH VERY POOR PROGNOSIS I ANTICIPATE PROLONGED ICU LOS  Patient is critically ill.  Patient with Multiorgan failure and at high risk for cardiac arrest and death.    Corrin Parker, M.D.  [facility]  Medical Director [place] Director [facility] Cardio-P[facility] Department

## 2022-04-18 NOTE — Progress Notes (Signed)
Bathed patient and removed EMS bag and cut off clothing from the patient. Multiple pressure injuries noted to the back and buttock area. Patient was covered in dirt and debris.

## 2022-04-18 NOTE — Sepsis Progress Note (Addendum)
Notified bedside nurse of need to administer antibiotics. This pt is critically ill, intubated and requiring multiple life-saving interventions

## 2022-04-18 NOTE — ED Provider Notes (Signed)
I have discussed the case with Dr. Belia Heman.   He is critically ill with evidence of severe brain damage, cardiogenic shock and poor perfusion to his organs with multiorgan failure and ischemia to his limbs.  He is at high risk for deterioration and I feel that he is unlikely to survive this critical illness.  Unfortunately there is no family or POA to discuss his care with.  In this situation I feel that it is best to make him DNR at this time as if he does arrest it is unlikely that he will have meaningful survival after a code.     Georga Hacking, MD Apr 14, 2022 Izell Lowndes

## 2022-04-18 NOTE — Death Summary Note (Signed)
DEATH SUMMARY   Patient Details  Name: Jeffrey Pittman MRN: [id number] DOB: Jan 29, 1956  Admission/Discharge Information   Admit Date:  May 11, 2022  Date of Death: Date of Death: May 11, 2022  Time of Death: Time of Death: 2014-01-21  Length of Stay: 0  Referring Physician: Patient, No Pcp Per   Reason(s) for Hospitalization  {Trauma:21859::"***"}  Diagnoses  Preliminary cause of death:  Secondary Diagnoses (including complications and co-morbidities):  Principal Problem:   Respiratory failure [facility])   Brief Hospital Course (including significant findings, care, treatment, and services provided and events leading to death)  Jeffrey Pittman is a 66 y.o. year old male who ***    Pertinent Labs and Studies  Significant Diagnostic Studies CT Maxillofacial Wo Contrast  Result Date: 2022/05/11 CLINICAL DATA:  Blunt facial trauma. Patient found on floor unresponsive. EXAM: CT MAXILLOFACIAL WITHOUT CONTRAST TECHNIQUE: Multidetector CT imaging of the maxillofacial structures was performed. Multiplanar CT image reconstructions were also generated. RADIATION DOSE REDUCTION: This exam was performed according to the departmental dose-optimization program which includes automated exposure control, adjustment of the mA and/or kV according to patient size and/or use of iterative reconstruction technique. COMPARISON:  None Available. FINDINGS: Osseous: The nasal bone appears intact. The bilateral zygomatic arches, bilateral temporomandibular joints, mandible, and bilateral pterygoid plates are intact. Orbits: The orbital globes are intact. The superior inferior, medial and lateral aspect of each bony orbit is intact. Sinuses: Mild frontal frontal sinus layering air-fluid levels. Moderate opacification of the ethmoid air cells. Mild peripheral maxillary sinus mucosal opacification. The visualized mastoid air cells are clear. Soft tissues: Negative. Limited intracranial: Redemonstration of known bilateral frontal and  posteroinferior right occipital likely subacute infarcts with associated hemorrhage subarachnoid hemorrhage. Hypodensity within the right parietal lobe suspicious for an subacute infarction. Partial visualization of endotracheal tube and orogastric tube. IMPRESSION: No acute facial fracture is seen. Electronically Signed   By: Neita Garnet M.D.   On: May 11, 2022 16:31   CT HEAD WO CONTRAST ( )  Result Date: 2022/05/11 CLINICAL DATA:  Minor head trauma. Found down for unknown period of time. EXAM: CT HEAD WITHOUT CONTRAST TECHNIQUE: Contiguous axial images were obtained from the base of the skull through the vertex without intravenous contrast. RADIATION DOSE REDUCTION: This exam was performed according to the departmental dose-optimization program which includes automated exposure control, adjustment of the mA and/or kV according to patient size and/or use of iterative reconstruction technique. COMPARISON:  None Available. FINDINGS: Brain: There is mild cortical atrophy, within normal limits for patient age. The ventricles are normal in configuration. The basilar cisterns are patent. There is hyperdensity along the borders of the gyri of the region mid right frontal lobe (axial series 3 images 20 through 26, sagittal images 14 through 20, and coronal images 31 through 37) with surrounding hypodensity suspicious for a subacute infarct with associated hemorrhage. There may be trace subarachnoid blood on axial series 2, image 23. Additional similar hyperdensity along the gyri with surrounding cortical gray-white interface edema (axial images 20 through 25 and sagittal images 43 through 46) suspicious for additional subacute infarct with associated hemorrhage. Similar findings of likely subacute infarct with associated hemorrhage within the right posteroinferior occipital lobe (coronal image 57 sagittal image 20, axial images 6 through 10). There is hypodensity within the right parietal lobe with loss of the  cortical gray-white interface which with fairly well-demarcated borders suggesting a likely subacute infarct, (axial image 21 and sagittal image 14). No hemorrhage is seen in this region. No midline shift. Vascular:  Atherosclerotic calcifications are seen within the major skull base arteries. Skull: No acute fracture is seen. Sinuses/Orbits: The orbital globes are intact. Mild to moderate layering mucosal opacification and bubbly lucencies within the frontal sinuses. Moderate ethmoid air cell mucosal thickening. Mild peripheral bilateral maxillary sinus mucosal thickening. Mild layering air-fluid level within the sphenoid sinus. The visualized mastoid air cells are clear. Other: None. IMPRESSION: 1. 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 Possible trace right frontal subarachnoid hemorrhage. A larger region of subacute infarct within the right parietal lobe is seen that does not demonstrate hemorrhage. Per discussion with the ordering clinician, the patient was found down for an unknown period of time. If the patient can safely undergo MRI, that may help further characterize. 2. Moderate paranasal sinus mucosal opacification including air-fluid levels compatible with acute sinusitis. Critical Value/emergent results were called by telephone at the time of interpretation on May 09, 2022 at 4:05 pm to provider Augusto Gamble , who verbally acknowledged these results. Electronically Signed   By: Neita Garnet M.D.   On: 05/09/2022 16:29   CT Cervical Spine Wo Contrast  Result Date: 09-May-2022 CLINICAL DATA:  Provided history: Neck trauma. Altered mental status. Patient found down, unresponsive. EXAM: CT CERVICAL SPINE WITHOUT CONTRAST TECHNIQUE: Multidetector CT imaging of the cervical spine was performed without intravenous contrast. Multiplanar CT image reconstructions were also generated. RADIATION DOSE REDUCTION: This exam was performed  according to the departmental dose-optimization program which includes automated exposure control, adjustment of the mA and/or kV according to patient size and/or use of iterative reconstruction technique. COMPARISON:  No pertinent prior exams available for comparison. FINDINGS: Alignment: No significant spondylolisthesis. Skull base and vertebrae: The basion-dental and atlanto-dental intervals are maintained.No evidence of acute fracture to the cervical spine. Soft tissues and spinal canal: No prevertebral fluid or swelling. No visible canal hematoma. Disc levels: Cervical spondylosis with multilevel disc space narrowing, disc bulges, posterior disc osteophytes, uncovertebral hypertrophy and facet arthrosis. Disc space narrowing is greatest at C5-C6 (advanced at this level). Multilevel spinal canal stenosis, most notably as follows. At C5-C6, a posterior disc osteophyte complex contributes to apparent moderate/severe spinal canal stenosis. At C6-C7, a posterior disc osteophyte complex contributes to apparent moderate spinal canal stenosis. Bony neural foraminal narrowing bilaterally at C5-C6. Multilevel ventral osteophytes, including a prominent ventral osteophyte at C4-C5. Upper chest: No consolidation within the imaged lung apices. No visible pneumothorax. Partially imaged right pleural effusion. Other: Partially imaged ET and enteric tubes. IMPRESSION: No evidence of acute fracture to the cervical spine. Cervical spondylosis. Notably, there is apparent moderate/severe spinal canal stenosis at C5-C6. Bilateral bony neural foraminal narrowing also present at this level. Apparent moderate spinal canal stenosis at C6-C7. Electronically Signed   By: Jackey Loge D.O.   On: May 09, 2022 16:21   CT CHEST ABDOMEN PELVIS WO CONTRAST  Result Date: 05-09-2022 CLINICAL DATA:  Sepsis EXAM: CT CHEST, ABDOMEN AND PELVIS WITHOUT CONTRAST TECHNIQUE: Multidetector CT imaging of the chest, abdomen and pelvis was performed  following the standard protocol without IV contrast. RADIATION DOSE REDUCTION: This exam was performed according to the departmental dose-optimization program which includes automated exposure control, adjustment of the mA and/or kV according to patient size and/or use of iterative reconstruction technique. COMPARISON:  09/28/2016 FINDINGS: CT CHEST FINDINGS Cardiovascular: Mild cardiomegaly. Diffuse coronary artery calcifications. Scattered moderate aortic calcifications. No aneurysm. Mediastinum/Nodes: No mediastinal, hilar, or axillary adenopathy. Endotracheal tube in the midtrachea. NG tube noted in the esophagus, terminating in the stomach.  Thyroid unremarkable. Lungs/Pleura: Small to moderate right pleural effusion and small left pleural effusion. Dependent compressive atelectasis in both lower lobes. Peribronchial thickening noted. Mucous plugging in the lower lobes with bibasilar atelectasis. Musculoskeletal: Chest wall soft tissues are unremarkable. No acute bony abnormality. CT ABDOMEN PELVIS FINDINGS Hepatobiliary: No focal hepatic abnormality. Gallbladder unremarkable. Pancreas: No focal abnormality or ductal dilatation. Spleen: No focal abnormality.  Normal size. Adrenals/Urinary Tract: No adrenal abnormality. No focal renal abnormality. No stones or hydronephrosis. Foley catheter in place with bladder decompressed. Stomach/Bowel: Stomach, large and small bowel grossly unremarkable. Few scattered left colonic diverticula. Vascular/Lymphatic: Aortic atherosclerosis. No evidence of aneurysm or adenopathy. Reproductive: No visible focal abnormality. Other: No free fluid or free air. Musculoskeletal: No acute bony abnormality. IMPRESSION: Bilateral pleural effusions, right larger than left. Peribronchial thickening with mucous plugging in the lower lobe airways bilaterally. Dependent and bibasilar atelectasis. Coronary artery disease, aortic atherosclerosis. Few scattered left colonic diverticula.  No  active diverticulitis. No acute findings in the abdomen or pelvis. Electronically Signed   By: Charlett Nose M.D.   On: April 29, 2022 15:42   DG Chest Portable 1 View  Result Date: Apr 29, 2022 CLINICAL DATA:  ETT and OG tube placement. Patient found unresponsive during welfare check. EXAM: PORTABLE CHEST 1 VIEW COMPARISON:  Radiograph September 28, 2016. FINDINGS: Endotracheal tube with tip projecting over the upper thoracic trachea approximately 6.8 cm from the carina. Nasogastric tube courses below the diaphragm with tip excluded by collimation. Defibrillator lead projects over the chest. Cardiac enlargement with central vascular prominence with probable mild interstitial edema. No focal airspace consolidation. No visible pleural effusion or pneumothorax. No acute osseous abnormality. IMPRESSION: Endotracheal tube with tip projecting over the upper thoracic trachea. Cardiomegaly with central vascular prominence and probable mild interstitial edema. Electronically Signed   By: Maudry Mayhew M.D.   On: 04/29/22 14:25   DG Abd 1 View  Result Date: 04-29-22 CLINICAL DATA:  NG tube placement EXAM: ABDOMEN - 1 VIEW COMPARISON:  None Available. FINDINGS: Tip of NG tube is seen in the medial aspect of fundus of the stomach. Side port in the NG tube is noted in the lower thoracic esophagus close to the gastroesophageal junction. Transverse diameter of the heart is increased. Bowel gas pattern in the upper abdomen is unremarkable. Rest of the abdomen and pelvis are not included in the radiograph. IMPRESSION: Tip of NG tube is seen in the stomach. Side port in the NG tube is noted in the lower thoracic esophagus close to the gastroesophageal junction. NG tube should be advanced 5-10 cm to place the side port within the stomach. Electronically Signed   By: Ernie Avena M.D.   On: Apr 29, 2022 14:24    Microbiology Recent Results (from the past 240 hour(s))  SARS Coronavirus 2 by RT PCR (hospital order,  performed in [facility] hospital lab) *cepheid single result test* Anterior Nasal Swab     Status: None   Collection Time: 2022-04-29  1:59 PM   Specimen: Anterior Nasal Swab  Result Value Ref Range Status   SARS Coronavirus 2 by RT PCR NEGATIVE NEGATIVE Final    Comment: (NOTE) SARS-CoV-2 target nucleic acids are NOT DETECTED.  The SARS-CoV-2 RNA is generally detectable in upper and lower respiratory specimens during the acute phase of infection. The lowest concentration of SARS-CoV-2 viral copies this assay can detect is 250 copies / mL. A negative result does not preclude SARS-CoV-2 infection and should not be used as the sole basis for treatment or  other patient management decisions.  A negative result may occur with improper specimen collection / handling, submission of specimen other than nasopharyngeal swab, presence of viral mutation(s) within the areas targeted by this assay, and inadequate number of viral copies (<250 copies / mL). A negative result must be combined with clinical observations, patient history, and epidemiological information.  Fact Sheet for Patients:   RoadLapTop.co.za  Fact Sheet for Healthcare Providers: http://kim-miller.com/  This test is not yet approved or  cleared by the [place] FDA and has been authorized for detection and/or diagnosis of SARS-CoV-2 by FDA under an Emergency Use Authorization (EUA).  This EUA will remain in effect (meaning this test can be used) for the duration of the COVID-19 declaration under Section 564(b)(1) of the Act, 21 U.S.C. section 360bbb-3(b)(1), unless the authorization is terminated or revoked sooner.  Performed at [facility] [address] [place], [place] [address]   MRSA Next Gen by PCR, Nasal     Status: None   Collection Time: Apr 17, 2022  7:26 PM   Specimen: Nasal Mucosa; Nasal Swab  Result Value Ref Range Status   MRSA by PCR Next Gen NOT  DETECTED NOT DETECTED Final    Comment: (NOTE) The GeneXpert MRSA Assay (FDA approved for NASAL specimens only), is one component of a comprehensive MRSA colonization surveillance program. It is not intended to diagnose MRSA infection nor to guide or monitor treatment for MRSA infections. Test performance is not FDA approved in patients less than 22 years old. Performed at [facility], [address] [place], [place] [address]     Lab Basic Metabolic Panel: Recent Labs  Lab Apr 17, 2022 1358  NA 143  K 5.1  CL 104  CO2 19*  GLUCOSE 360*  BUN 70*  CREATININE 3.79*  CALCIUM 9.2  MG 2.7*   Liver Function Tests: Recent Labs  Lab April 17, 2022 1358  AST 173*  ALT 110*  ALKPHOS 119  BILITOT 2.8*  PROT 6.7  ALBUMIN 3.3*   Recent Labs  Lab 04-17-22 1358  LIPASE 99*   Recent Labs  Lab 17-Apr-2022 1350  AMMONIA 69*   CBC: Recent Labs  Lab 04/17/2022 1358  WBC 10.7*  NEUTROABS 8.3*  HGB 20.5*  HCT 62.0*  MCV 97.8  PLT 163   Cardiac Enzymes: Recent Labs  Lab 2022-04-17 1358  CKTOTAL 1,038*   Sepsis Labs: Recent Labs  Lab 2022/04/17 1350 April 17, 2022 1358 2022/04/17 1753  WBC  --  10.7*  --   LATICACIDVEN 5.1*  --  4.9*    Procedures/Operations  ***   Loraine Leriche Apr 17, 2022, 11:38 PM

## 2022-04-18 NOTE — Plan of Care (Addendum)
   AT THIS TIME, PATIENT WITH SEVERE CARDIOGENIC SHOCK WITH MULTIORGAN FAILURE WITH SEVERE BRAIN DAMAGE WITH ISCHEMIC LIMBS  PATIENT HAS SEVERE HYPOXIA DESPITE FULL VENT SUPPORT, PATIENT UNABLE TO MAINTAIN BP DESPITE HIGH DOSES OF PRESSORS  PATIENT IS IN THE DYING PROCESS  IN CORRESPONDENCE WITH ER PHYSICIAN DR MCHUGH, AND THE FACT THAT WAS REPORTED TO ICU MEDICAL TEAM THAT ONLY FAMILY  MEMBER PATIENT HAD  DIED, IT IS NECESSARY NOT TO PROLONG PATIENTS SUFFERING AND WILL ENDORSE DNR STATUS.  WE WILL CONTINUE MEDICAL MANAGEMENT AT THIS TIME, I ANTICIPATE DEATH VERY SOON    Amunique Neyra Santiago Glad, M.D.  [facility]  Medical Director [facility] Medical Director [facility] Cardio-Pulmonary Department

## 2022-04-18 NOTE — Progress Notes (Signed)
An USGPIV (ultrasound guided PIV) 20G x 2.5" on LFA has been placed for short-term vasopressor infusion. A correctly placed ivWatch must be used when administering Vasopressors. Should this treatment be needed beyond 72 hours, central line access should be obtained.  It will be the responsibility of the bedside nurse to follow best practice to prevent extravasations.

## 2022-04-18 NOTE — Progress Notes (Signed)
Pt arrived to rm ICU 8, rt at bedside for vent management, MD Kasa at bedside, RN informed MD Kasa of lack of pedal pulses, mottling, and unable to obtain a BP. Report given to receiving RN.

## 2022-04-18 NOTE — ED Notes (Signed)
Keeping patient here  to be admitted to ICU

## 2022-04-18 NOTE — Sepsis Progress Note (Signed)
Code Sepsis protocol being monitored by eLink. 

## 2022-04-18 NOTE — ED Notes (Signed)
Patient transported by RN and respiratory therapist to ICU with monitor.

## 2022-04-18 NOTE — Progress Notes (Signed)
CODE SEPSIS - PHARMACY COMMUNICATION  **Broad Spectrum Antibiotics should be administered within 1 hour of Sepsis diagnosis**  Time Code Sepsis Called/Page Received: 1455  Antibiotics Ordered: cefepime 2 grams x 1, vancomycin 1000 mg x 1, metronidazole 500 mg x 1  Time of 1st antibiotic administration: 1600  Additional action taken by pharmacy: messaged  If necessary, Name of Provider/Nurse Contacted: RN    Jaynie Bream ,PharmD Clinical Pharmacist  04/28/2022  3:17 PM

## 2022-04-18 NOTE — ED Provider Notes (Addendum)
Patient was signed out to me at 3 PM pending imaging.  In brief this is a 66 year old male who presented critically ill after being found down was intubated for airway protection found to be in A-fib with RVR and hypotensive.   Patient tachycardic to the 180s and hypotensive.  I have ordered him phenylephrine and started amiodarone bolus and drip.  With rates that high suspected this is contributing to his hypotension.  We will avoid negative inotropes given the hypotension.   Patient CT of the head I interpreted and spoke to radiology about shows multiple subacute infarcts with possible hemorrhagic conversion versus subarachnoid hemorrhage.  No acute findings on the CT of the chest abdomen pelvis.  I discussed with Dr. Wilford Corner with neurology who initially recommends transfer to [facility].  However patient is on phenylephrine and still persistently hypotensive legs becoming more mottled I do not think that he is stable to be transferred at this time.  I discussed with Dr. Belia Heman and he agrees to admit the patient here.  I suspect that patient is throwing clots due to his A-fib.    Georga Hacking, MD 05-01-22 1906    Georga Hacking, MD 05-01-2022 [place]

## 2022-04-18 NOTE — ED Provider Notes (Addendum)
[facility] Provider Note    Event Date/Time   First MD Initiated Contact with Patient April 20, 2022 1323     (approximate)   History   Altered Mental Status and unresponsive (Coming from home by EMS, pt had not responded for a week to phone calls before someone checked in on him, pt found on floor unresponsive.)   HPI  Jeffrey Pittman is a 66 y.o. male who was last seen over a week ago who comes in with concern for unresponsiveness and altered mental status.  According to EMS somebody has been trying to call him but is not been able to get a response.  Someone went into check on him and he was found on the ground unresponsive.  Patient looks like he has been there for a long time with skin color changes but still breathing.  Patient tachycardic to the 180s.     Physical Exam   Triage Vital Signs: Blood pressure (!) 125/109, pulse (!) 46, temperature (!) 101.6 F (38.7 C), resp. rate (!) 22, SpO2 91 %.   Most recent vital signs: Vitals:   04/20/22 1415 04-20-2022 1425  BP: 108/72 (!) 125/109  Pulse: (!) 46   Resp: (!) 22   Temp: (!) 101.3 F (38.5 C) (!) 101.6 F (38.7 C)  SpO2: 91%      General: Awake, no distress.  CV:  Good peripheral perfusion.  Resp:  Normal effort.  Abd:  No distention.  Other:  Patient is unresponsive to painful stimuli.  He is still breathing.  His pupil on the right is slightly bigger than the left but reactive bilaterally but he is got a lot of chemosis noted to his bilateral eyes.  He is got discoloration and edema noted to his legs.   ED Results / Procedures / Treatments   Labs (all labs ordered are listed, but only abnormal results are displayed) Labs Reviewed  CBG MONITORING, ED - Abnormal; Notable for the following components:      Result Value   Glucose-Capillary 385 (*)    All other components within normal limits  CULTURE, BLOOD (ROUTINE X 2)  CULTURE, BLOOD (ROUTINE X 2)  CBC WITH DIFFERENTIAL/PLATELET   COMPREHENSIVE METABOLIC PANEL  LIPASE, BLOOD  ETHANOL  URINALYSIS, ROUTINE W REFLEX MICROSCOPIC  URINE DRUG SCREEN, QUALITATIVE ([facility] ONLY)  MAGNESIUM  CK  LACTIC ACID, PLASMA  LACTIC ACID, PLASMA  AMMONIA  BLOOD GAS, VENOUS  BRAIN NATRIURETIC PEPTIDE  TROPONIN I (HIGH SENSITIVITY)     EKG  My interpretation of EKG:  Atrial fibrillation with a rate of 185 without any ST elevation or T wave inversions, bundle branch block  RADIOLOGY I have reviewed the xray personally and interpreted postintubation and ET tube slightly high   PROCEDURES:  Critical Care performed: Yes, see critical care procedure note(s)  .1-3 Lead EKG Interpretation  Performed by: Concha Se, MD Authorized by: Concha Se, MD     Interpretation: abnormal     ECG rate:  180   ECG rate assessment: tachycardic     Rhythm: atrial fibrillation     Ectopy: none     Conduction: normal   .Critical Care  Performed by: Concha Se, MD Authorized by: Concha Se, MD   Critical care provider statement:    Critical care time (minutes):  30   Critical care was necessary to treat or prevent imminent or life-threatening deterioration of the following conditions:  Respiratory failure   Critical care  was time spent personally by me on the following activities:  Development of treatment plan with patient or surrogate, discussions with consultants, evaluation of patient's response to treatment, examination of patient, ordering and review of laboratory studies, ordering and review of radiographic studies, ordering and performing treatments and interventions, pulse oximetry, re-evaluation of patient's condition and review of old charts Date/Time: 05-12-2022 2:05 PM  Performed by: Concha Se, MDPre-anesthesia Checklist: Patient identified Oxygen Delivery Method: Ambu bag Laryngoscope Size: Glidescope Grade View: Grade I Number of attempts: 1 Placement Confirmation: ETT inserted through vocal cords under  direct vision, CO2 detector, Positive ETCO2 and Breath sounds checked- equal and bilateral Difficulty Due To: Difficulty was unanticipated       MEDICATIONS ORDERED IN ED: Medications  sodium chloride 0.9 % bolus 1,000 mL (has no administration in time range)  etomidate (AMIDATE) injection 10 mg (has no administration in time range)  rocuronium (ZEMURON) injection 100 mg (has no administration in time range)     IMPRESSION / MDM / ASSESSMENT AND PLAN / ED COURSE  I reviewed the triage vital signs and the nursing notes.   Patient's presentation is most consistent with acute presentation with potential threat to life or bodily function.   Differentials intracranial hemorrhage, PE, heart attack, DKA.  Patient was moaning but not able to answer questions or follow any commands.  He was unresponsive to pain in his extremities.  Patient was intubated for airway protection.  2 L of IV fluids were initiated for tachycardia and concern for dehydration.  Labs ordered to further evaluate.  Patient noted to be febrile.  Patient getting IV Tylenol, fluids we will start on broad-spectrum antibiotics for sepsis.  We will do broad-spectrum antibiotics.  Arterial blood glass shows pH of 7.28.  COVID is negative.  Troponin is elevated could be demand from A-fib.  We will hold off on heparin until CT head.  White count is only slightly elevated.  His creatinine is elevated to 3.7 he is getting some IV fluids  This could be cardiogenic shock versus septic shock.  Patient getting current IV fluids and will give a dose of metoprolol to help with A-fib with RVR.  Patient handed off to oncoming team pending CT imaging to help figure out which 1 of these it could be.    I did let Dr. Belia Heman know about the case but pending the CT head  The patient is on the cardiac monitor to evaluate for evidence of arrhythmia and/or significant heart rate changes.      FINAL CLINICAL IMPRESSION(S) / ED DIAGNOSES   Final  diagnoses:  Altered mental status, unspecified altered mental status type  Respiratory failure with hypoxia, unspecified chronicity (HCC)  Sepsis, due to unspecified organism, unspecified whether acute organ dysfunction present [facility])     Rx / DC Orders   ED Discharge Orders     None        Note:  This document was prepared using Dragon voice recognition software and may include unintentional dictation errors.   Concha Se, MD 05-12-2022 1457    Concha Se, MD May 12, 2022 [id number]

## 2022-04-18 NOTE — Progress Notes (Signed)
Patient belongings brought by EMS staff. Placed in room

## 2022-04-18 NOTE — Progress Notes (Signed)
Patient asystole on the monitor. All drips stopped, ventilator placed in stand by, pronounced by this RN and Aundra Millet, Consulting civil engineer. Central tele was notified and confirmed TOD.

## 2022-04-18 NOTE — Consult Note (Signed)
PHARMACY CONSULT NOTE - FOLLOW UP  Pharmacy Consult for Electrolyte Monitoring and Replacement   Recent Labs: Potassium (mmol/L)  Date Value  2022/04/27 5.1   Magnesium (mg/dL)  Date Value  20/94/7096 2.7 (H)   Calcium (mg/dL)  Date Value  28/36/6294 9.2   Albumin (g/dL)  Date Value  76/54/6503 3.3 (L)   Sodium (mmol/L)  Date Value  2022-04-27 143     Assessment: 66 year old male presented to [facility] Apr 27, 2022 with concern for unresponsiveness and altered mental status. Found to Afib with RVR and have acute respiratory failure requiring intubation and transfer to ICU. Pharmacy has been consulted to monitor and replace electrolytes as needed.  Goal of Therapy:  K > 4.0 Mag > 2.0 All other electrolytes WNL  Plan:  No additional electrolyte replacement warranted at this time Recheck all electrolytes with AM labs  Sharen Hones, PharmD, BCPS Clinical Pharmacist   2022-04-27 5:48 PM

## 2022-04-18 NOTE — ED Notes (Signed)
Called [facility] spoke to Maud for transfer to the NeuroICU  [phone]

## 2022-04-18 NOTE — Consult Note (Signed)
PHARMACY -  BRIEF ANTIBIOTIC NOTE   Pharmacy has received consult(s) for vancomycin and cefepime from an ED provider. Patient is also ordered metronidazole. The patient's profile has been reviewed for ht/wt/allergies/indication/available labs.    NKDA. Scr 3.79 noted. No recent weight available in chart  One time order(s) placed for  --Vancomycin 1 g IV --Cefepime 2 g IV  Further antibiotics/pharmacy consults should be ordered by admitting physician if indicated.                       Thank you, Tressie Ellis 04-13-2022  2:51 PM

## 2022-04-18 NOTE — Death Summary Note (Incomplete)
DEATH SUMMARY   Patient Details  Name: Jeffrey Pittman Faidley MRN: [id number] DOB: 1956-07-04  Admission/Discharge Information   Admit Date:  10-24-21  Date of Death: Date of Death: 14-Dec-2021  Time of Death: Time of Death: 2015  Length of Stay: 0  Referring Physician: Patient, No Pcp Per   Reason(s) for Hospitalization  {Trauma:21859::"***"}  Diagnoses  Preliminary cause of death:  Secondary Diagnoses (including complications and co-morbidities):  Principal Problem:   Respiratory failure [facility](HCC)   Brief Hospital Course (including significant findings, care, treatment, and services provided and events leading to death)  Jeffrey Pittman Kapuscinski is a 66 Pittman.o. year old male known HFrEF, Pulm HTN, HTN, Venous insufficiency w/ LE ulceration , afib on Eliquis who presented [facility] ED after being found down unresponsive for unknown period of time.  According to ED reports, EMS reported that someone had been trying to contact him but unable to get in touch with him so they went to check on him and found him on the floor. The last time he was seen normal was over a week ago.  ED Course: In the emergency department, the temperature was 38.5C, the heart rate 46 beats/minute, the blood pressure  m125/109 m Hg, the respiratory rate 22 breaths/minute, and the oxygen saturation 91% on NRB. He was unresponsive to painful stimuli.  He is still breathing.  His pupil on the right was slightly bigger than the left but reactive bilaterally but he had a lot of chemosis noted to his bilateral eyes.  He had mottled discoloration and edema noted to his legs.Patient was intubated in the ED for airway protection. Patient was noted to be in Afib with RVR upto 180's and hypotensive so was started on Amiodarone and Phenylephrine. A stat CTH obtained and showed multiple subacute infarcts with possible hemorrhagic conversion versus subarachnoid hemorrhage.  No acute findings on the CT of the chest abdomen pelvis. EDP discussed with Dr.  Wilford CornerArora with neurology who initially [facility] however due to high risk for decompensation pt was deemed unsafe for transfer. Initial labs showed  white count of 10.7 Glucose 360, BUN/Cr: 70/3.79, AST/ALT:173/110, Anion Gap Metabolic acidosis, CK 1,038, Lactate 5.1>4.9, Troponin 453, BNP 1466, Arterial Blood Gas result:  pO2 293; pCO2 38; pH 7.28;  HCO3 17.9, %O2 Sat 100. Patient admitted to ICU and PCCM consulted.  ICU Course: Patient admitted to the ICU for management of:  Severe Sepsis with septic shock of unknown source, has multiple wounds likely source? Initial interventions/workup included: 3 L of NS/LR & Cefepime/ Vancomycin/ Metronidazole -Supplemental oxygen as needed, to maintain SpO2 > 90% -F/u cultures, trend lactic/ PCT -Monitor WBC/ fever curve -IV antibiotics Vancomycin & ceftriaxone  -Pressors for MAP goal >65 -Strict I/O's  Acute Hypoxic Respiratory Failure in the setting of severe sepsis, strokes, pneumonia and CHF -Wean PEEP and FiO2 for sats greater than 90% -Plateau pressures less than 30 cm H20 -VAP bundle in place -Intermittent chest x-ray & ABG -F/u cultures, trend PCT -wean sedation/analgesia for RASS goal 0   Severe AKI in the setting of Acute Rhabdomyolysis Anion gap metabolic acidosis Severe lactic acidosis -Monitor I&O's / urinary output -Follow BMP -Ensure adequate renal perfusion -Avoid nephrotoxic agents as able -Replace electrolytes as indicated   Transaminitis -CT Abdomen/Pelvis negative -Likely shock liver -Hepatitis panel pending -Follow Liver Enzymes   Acute Metabolic Encephalopathy i -Provide supportive care -Promote normal sleep/wake cycle -Avoid sedating meds as able -CT Head negative for acute intracranial abnormality Pertinent Labs and  Studies  Significant Diagnostic Studies CT Maxillofacial Wo Contrast  Result Date: 05-01-22 CLINICAL DATA:  Blunt facial trauma. Patient found on floor unresponsive. EXAM: CT  MAXILLOFACIAL WITHOUT CONTRAST TECHNIQUE: Multidetector CT imaging of the maxillofacial structures was performed. Multiplanar CT image reconstructions were also generated. RADIATION DOSE REDUCTION: This exam was performed according to the departmental dose-optimization program which includes automated exposure control, adjustment of the mA and/or kV according to patient size and/or use of iterative reconstruction technique. COMPARISON:  None Available. FINDINGS: Osseous: The nasal bone appears intact. The bilateral zygomatic arches, bilateral temporomandibular joints, mandible, and bilateral pterygoid plates are intact. Orbits: The orbital globes are intact. The superior inferior, medial and lateral aspect of each bony orbit is intact. Sinuses: Mild frontal frontal sinus layering air-fluid levels. Moderate opacification of the ethmoid air cells. Mild peripheral maxillary sinus mucosal opacification. The visualized mastoid air cells are clear. Soft tissues: Negative. Limited intracranial: Redemonstration of known bilateral frontal and posteroinferior right occipital likely subacute infarcts with associated hemorrhage subarachnoid hemorrhage. Hypodensity within the right parietal lobe suspicious for an subacute infarction. Partial visualization of endotracheal tube and orogastric tube. IMPRESSION: No acute facial fracture is seen. Electronically Signed   By: Neita Garnet M.D.   On: May 01, 2022 16:31   CT HEAD WO CONTRAST ( )  Result Date: 05-01-22 CLINICAL DATA:  Minor head trauma. Found down for unknown period of time. EXAM: CT HEAD WITHOUT CONTRAST TECHNIQUE: Contiguous axial images were obtained from the base of the skull through the vertex without intravenous contrast. RADIATION DOSE REDUCTION: This exam was performed according to the departmental dose-optimization program which includes automated exposure control, adjustment of the mA and/or kV according to patient size and/or use of iterative  reconstruction technique. COMPARISON:  None Available. FINDINGS: Brain: There is mild cortical atrophy, within normal limits for patient age. The ventricles are normal in configuration. The basilar cisterns are patent. There is hyperdensity along the borders of the gyri of the region mid right frontal lobe (axial series 3 images 20 through 26, sagittal images 14 through 20, and coronal images 31 through 37) with surrounding hypodensity suspicious for a subacute infarct with associated hemorrhage. There may be trace subarachnoid blood on axial series 2, image 23. Additional similar hyperdensity along the gyri with surrounding cortical gray-white interface edema (axial images 20 through 25 and sagittal images 43 through 46) suspicious for additional subacute infarct with associated hemorrhage. Similar findings of likely subacute infarct with associated hemorrhage within the right posteroinferior occipital lobe (coronal image 57 sagittal image 20, axial images 6 through 10). There is hypodensity within the right parietal lobe with loss of the cortical gray-white interface which with fairly well-demarcated borders suggesting a likely subacute infarct, (axial image 21 and sagittal image 14). No hemorrhage is seen in this region. No midline shift. Vascular: Atherosclerotic calcifications are seen within the major skull base arteries. Skull: No acute fracture is seen. Sinuses/Orbits: The orbital globes are intact. Mild to moderate layering mucosal opacification and bubbly lucencies within the frontal sinuses. Moderate ethmoid air cell mucosal thickening. Mild peripheral bilateral maxillary sinus mucosal thickening. Mild layering air-fluid level within the sphenoid sinus. The visualized mastoid air cells are clear. Other: None. IMPRESSION: 1. 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 Possible trace right frontal subarachnoid hemorrhage. A larger  region of subacute infarct within the right parietal lobe is seen that does not demonstrate hemorrhage. Per discussion with the ordering clinician, the patient was found down for an  unknown period of time. If the patient can safely undergo MRI, that may help further characterize. 2. Moderate paranasal sinus mucosal opacification including air-fluid levels compatible with acute sinusitis. Critical Value/emergent results were called by telephone at the time of interpretation on 05/09/2022 at 4:05 pm to provider Augusto Gamble , who verbally acknowledged these results. Electronically Signed   By: Neita Garnet M.D.   On: May 09, 2022 16:29   CT Cervical Spine Wo Contrast  Result Date: 05/09/2022 CLINICAL DATA:  Provided history: Neck trauma. Altered mental status. Patient found down, unresponsive. EXAM: CT CERVICAL SPINE WITHOUT CONTRAST TECHNIQUE: Multidetector CT imaging of the cervical spine was performed without intravenous contrast. Multiplanar CT image reconstructions were also generated. RADIATION DOSE REDUCTION: This exam was performed according to the departmental dose-optimization program which includes automated exposure control, adjustment of the mA and/or kV according to patient size and/or use of iterative reconstruction technique. COMPARISON:  No pertinent prior exams available for comparison. FINDINGS: Alignment: No significant spondylolisthesis. Skull base and vertebrae: The basion-dental and atlanto-dental intervals are maintained.No evidence of acute fracture to the cervical spine. Soft tissues and spinal canal: No prevertebral fluid or swelling. No visible canal hematoma. Disc levels: Cervical spondylosis with multilevel disc space narrowing, disc bulges, posterior disc osteophytes, uncovertebral hypertrophy and facet arthrosis. Disc space narrowing is greatest at C5-C6 (advanced at this level). Multilevel spinal canal stenosis, most notably as follows. At C5-C6, a posterior disc osteophyte complex  contributes to apparent moderate/severe spinal canal stenosis. At C6-C7, a posterior disc osteophyte complex contributes to apparent moderate spinal canal stenosis. Bony neural foraminal narrowing bilaterally at C5-C6. Multilevel ventral osteophytes, including a prominent ventral osteophyte at C4-C5. Upper chest: No consolidation within the imaged lung apices. No visible pneumothorax. Partially imaged right pleural effusion. Other: Partially imaged ET and enteric tubes. IMPRESSION: No evidence of acute fracture to the cervical spine. Cervical spondylosis. Notably, there is apparent moderate/severe spinal canal stenosis at C5-C6. Bilateral bony neural foraminal narrowing also present at this level. Apparent moderate spinal canal stenosis at C6-C7. Electronically Signed   By: Jackey Loge D.O.   On: 05-09-2022 16:21   CT CHEST ABDOMEN PELVIS WO CONTRAST  Result Date: 2022/05/09 CLINICAL DATA:  Sepsis EXAM: CT CHEST, ABDOMEN AND PELVIS WITHOUT CONTRAST TECHNIQUE: Multidetector CT imaging of the chest, abdomen and pelvis was performed following the standard protocol without IV contrast. RADIATION DOSE REDUCTION: This exam was performed according to the departmental dose-optimization program which includes automated exposure control, adjustment of the mA and/or kV according to patient size and/or use of iterative reconstruction technique. COMPARISON:  09/28/2016 FINDINGS: CT CHEST FINDINGS Cardiovascular: Mild cardiomegaly. Diffuse coronary artery calcifications. Scattered moderate aortic calcifications. No aneurysm. Mediastinum/Nodes: No mediastinal, hilar, or axillary adenopathy. Endotracheal tube in the midtrachea. NG tube noted in the esophagus, terminating in the stomach. Thyroid unremarkable. Lungs/Pleura: Small to moderate right pleural effusion and small left pleural effusion. Dependent compressive atelectasis in both lower lobes. Peribronchial thickening noted. Mucous plugging in the lower lobes with  bibasilar atelectasis. Musculoskeletal: Chest wall soft tissues are unremarkable. No acute bony abnormality. CT ABDOMEN PELVIS FINDINGS Hepatobiliary: No focal hepatic abnormality. Gallbladder unremarkable. Pancreas: No focal abnormality or ductal dilatation. Spleen: No focal abnormality.  Normal size. Adrenals/Urinary Tract: No adrenal abnormality. No focal renal abnormality. No stones or hydronephrosis. Foley catheter in place with bladder decompressed. Stomach/Bowel: Stomach, large and small bowel grossly unremarkable. Few scattered left colonic diverticula. Vascular/Lymphatic: Aortic atherosclerosis. No evidence of aneurysm or adenopathy. Reproductive: No visible focal abnormality. Other:  No free fluid or free air. Musculoskeletal: No acute bony abnormality. IMPRESSION: Bilateral pleural effusions, right larger than left. Peribronchial thickening with mucous plugging in the lower lobe airways bilaterally. Dependent and bibasilar atelectasis. Coronary artery disease, aortic atherosclerosis. Few scattered left colonic diverticula.  No active diverticulitis. No acute findings in the abdomen or pelvis. Electronically Signed   By: Charlett Nose M.D.   On: 03-May-2022 15:42   DG Chest Portable 1 View  Result Date: 05-03-2022 CLINICAL DATA:  ETT and OG tube placement. Patient found unresponsive during welfare check. EXAM: PORTABLE CHEST 1 VIEW COMPARISON:  Radiograph September 28, 2016. FINDINGS: Endotracheal tube with tip projecting over the upper thoracic trachea approximately 6.8 cm from the carina. Nasogastric tube courses below the diaphragm with tip excluded by collimation. Defibrillator lead projects over the chest. Cardiac enlargement with central vascular prominence with probable mild interstitial edema. No focal airspace consolidation. No visible pleural effusion or pneumothorax. No acute osseous abnormality. IMPRESSION: Endotracheal tube with tip projecting over the upper thoracic trachea. Cardiomegaly with  central vascular prominence and probable mild interstitial edema. Electronically Signed   By: Maudry Mayhew M.D.   On: 05/03/22 14:25   DG Abd 1 View  Result Date: 05/03/2022 CLINICAL DATA:  NG tube placement EXAM: ABDOMEN - 1 VIEW COMPARISON:  None Available. FINDINGS: Tip of NG tube is seen in the medial aspect of fundus of the stomach. Side port in the NG tube is noted in the lower thoracic esophagus close to the gastroesophageal junction. Transverse diameter of the heart is increased. Bowel gas pattern in the upper abdomen is unremarkable. Rest of the abdomen and pelvis are not included in the radiograph. IMPRESSION: Tip of NG tube is seen in the stomach. Side port in the NG tube is noted in the lower thoracic esophagus close to the gastroesophageal junction. NG tube should be advanced 5-10 cm to place the side port within the stomach. Electronically Signed   By: Ernie Avena M.D.   On: 2022/05/03 14:24    Microbiology Recent Results (from the past 240 hour(s))  SARS Coronavirus 2 by RT PCR (hospital order, performed in [facility] hospital lab) *cepheid single result test* Anterior Nasal Swab     Status: None   Collection Time: 2022-05-03  1:59 PM   Specimen: Anterior Nasal Swab  Result Value Ref Range Status   SARS Coronavirus 2 by RT PCR NEGATIVE NEGATIVE Final    Comment: (NOTE) SARS-CoV-2 target nucleic acids are NOT DETECTED.  The SARS-CoV-2 RNA is generally detectable in upper and lower respiratory specimens during the acute phase of infection. The lowest concentration of SARS-CoV-2 viral copies this assay can detect is 250 copies / mL. A negative result does not preclude SARS-CoV-2 infection and should not be used as the sole basis for treatment or other patient management decisions.  A negative result may occur with improper specimen collection / handling, submission of specimen other than nasopharyngeal swab, presence of viral mutation(s) within the areas targeted by  this assay, and inadequate number of viral copies (<250 copies / mL). A negative result must be combined with clinical observations, patient history, and epidemiological information.  Fact Sheet for Patients:   RoadLapTop.co.za  Fact Sheet for Healthcare Providers: http://kim-miller.com/  This test is not yet approved or  cleared by the [place] FDA and has been authorized for detection and/or diagnosis of SARS-CoV-2 by FDA under an Emergency Use Authorization (EUA).  This EUA will remain in effect (meaning this test can  be used) for the duration of the COVID-19 declaration under Section 564(b)(1) of the Act, 21 U.S.C. section 360bbb-3(b)(1), unless the authorization is terminated or revoked sooner.  Performed at [facility], [address] [place], [place] [address]   MRSA Next Gen by PCR, Nasal     Status: None   Collection Time: 04/23/2022  7:26 PM   Specimen: Nasal Mucosa; Nasal Swab  Result Value Ref Range Status   MRSA by PCR Next Gen NOT DETECTED NOT DETECTED Final    Comment: (NOTE) The GeneXpert MRSA Assay (FDA approved for NASAL specimens only), is one component of a comprehensive MRSA colonization surveillance program. It is not intended to diagnose MRSA infection nor to guide or monitor treatment for MRSA infections. Test performance is not FDA approved in patients less than 15 years old. Performed at [facility], [address] [place], [place] [address]     Lab Basic Metabolic Panel: Recent Labs  Lab 04/23/22 1358  NA 143  K 5.1  CL 104  CO2 19*  GLUCOSE 360*  BUN 70*  CREATININE 3.79*  CALCIUM 9.2  MG 2.7*   Liver Function Tests: Recent Labs  Lab 04/23/2022 1358  AST 173*  ALT 110*  ALKPHOS 119  BILITOT 2.8*  PROT 6.7  ALBUMIN 3.3*   Recent Labs  Lab 04-23-22 1358  LIPASE 99*   Recent Labs  Lab 2022/04/23 1350  AMMONIA 69*   CBC: Recent Labs  Lab 04-23-2022 1358   WBC 10.7*  NEUTROABS 8.3*  HGB 20.5*  HCT 62.0*  MCV 97.8  PLT 163   Cardiac Enzymes: Recent Labs  Lab 04-23-22 1358  CKTOTAL 1,038*   Sepsis Labs: Recent Labs  Lab 2022-04-23 1350 23-Apr-2022 1358 23-Apr-2022 1753  WBC  --  10.7*  --   LATICACIDVEN 5.1*  --  4.9*    Procedures/Operations  7/26: Intubation

## 2022-04-18 DEATH — deceased
